# Patient Record
Sex: Female | Born: 1985 | Race: Black or African American | Hispanic: No | Marital: Single | Smoking: Never smoker
Health system: Southern US, Community
[De-identification: ages and names within clinical notes are randomized; demographics above are authoritative.]

## PROBLEM LIST (undated history)

## (undated) DIAGNOSIS — A159 Respiratory tuberculosis unspecified: Secondary | ICD-10-CM

## (undated) DIAGNOSIS — F329 Major depressive disorder, single episode, unspecified: Secondary | ICD-10-CM

## (undated) DIAGNOSIS — K219 Gastro-esophageal reflux disease without esophagitis: Secondary | ICD-10-CM

## (undated) DIAGNOSIS — F419 Anxiety disorder, unspecified: Secondary | ICD-10-CM

## (undated) DIAGNOSIS — T7840XA Allergy, unspecified, initial encounter: Secondary | ICD-10-CM

## (undated) DIAGNOSIS — R011 Cardiac murmur, unspecified: Secondary | ICD-10-CM

## (undated) DIAGNOSIS — Z201 Contact with and (suspected) exposure to tuberculosis: Secondary | ICD-10-CM

## (undated) DIAGNOSIS — I10 Essential (primary) hypertension: Secondary | ICD-10-CM

## (undated) DIAGNOSIS — F32A Depression, unspecified: Secondary | ICD-10-CM

## (undated) HISTORY — DX: Gastro-esophageal reflux disease without esophagitis: K21.9

## (undated) HISTORY — PX: WISDOM TOOTH EXTRACTION: SHX21

## (undated) HISTORY — DX: Contact with and (suspected) exposure to tuberculosis: Z20.1

## (undated) HISTORY — DX: Essential (primary) hypertension: I10

## (undated) HISTORY — DX: Respiratory tuberculosis unspecified: A15.9

## (undated) HISTORY — DX: Cardiac murmur, unspecified: R01.1

## (undated) HISTORY — DX: Allergy, unspecified, initial encounter: T78.40XA

---
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
## 2015-11-25 ENCOUNTER — Emergency Department (HOSPITAL_COMMUNITY): Payer: Self-pay

## 2015-11-25 ENCOUNTER — Emergency Department (HOSPITAL_COMMUNITY)
Admission: EM | Admit: 2015-11-25 | Discharge: 2015-11-25 | Disposition: A | Discharge status: Home / Self Care | Payer: Self-pay | Attending: Emergency Medicine | Admitting: Emergency Medicine

## 2015-11-25 DIAGNOSIS — T07XXXA Unspecified multiple injuries, initial encounter: Secondary | ICD-10-CM

## 2015-11-25 DIAGNOSIS — Y999 Unspecified external cause status: Secondary | ICD-10-CM | POA: Insufficient documentation

## 2015-11-25 DIAGNOSIS — S20111A Abrasion of breast, right breast, initial encounter: Secondary | ICD-10-CM | POA: Insufficient documentation

## 2015-11-25 DIAGNOSIS — Z23 Encounter for immunization: Secondary | ICD-10-CM | POA: Insufficient documentation

## 2015-11-25 DIAGNOSIS — S1091XA Abrasion of unspecified part of neck, initial encounter: Secondary | ICD-10-CM | POA: Insufficient documentation

## 2015-11-25 DIAGNOSIS — Y9389 Activity, other specified: Secondary | ICD-10-CM | POA: Insufficient documentation

## 2015-11-25 DIAGNOSIS — Y9259 Other trade areas as the place of occurrence of the external cause: Secondary | ICD-10-CM | POA: Insufficient documentation

## 2015-11-25 DIAGNOSIS — S30811A Abrasion of abdominal wall, initial encounter: Secondary | ICD-10-CM | POA: Insufficient documentation

## 2015-11-25 DIAGNOSIS — S0083XA Contusion of other part of head, initial encounter: Secondary | ICD-10-CM | POA: Insufficient documentation

## 2015-11-25 MED ORDER — ACETAMINOPHEN 325 MG PO TABS
650.0000 mg | ORAL_TABLET | Freq: Once | ORAL | Status: AC
  Administered 2015-11-25: 650 mg via ORAL
  Filled 2015-11-25: qty 2

## 2015-11-25 MED ORDER — TETANUS-DIPHTH-ACELL PERTUSSIS 5-2.5-18.5 LF-MCG/0.5 IM SUSP
0.5000 mL | Freq: Once | INTRAMUSCULAR | Status: AC
  Administered 2015-11-25: 0.5 mL via INTRAMUSCULAR
  Filled 2015-11-25: qty 0.5
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
## 2016-03-20 ENCOUNTER — Emergency Department (HOSPITAL_COMMUNITY)
Admission: EM | Admit: 2016-03-20 | Discharge: 2016-03-20 | Disposition: A | Discharge status: Home / Self Care | Payer: Medicaid Other | Attending: Emergency Medicine | Admitting: Emergency Medicine

## 2016-03-20 ENCOUNTER — Encounter (HOSPITAL_COMMUNITY): Payer: Self-pay

## 2016-03-20 DIAGNOSIS — K029 Dental caries, unspecified: Secondary | ICD-10-CM | POA: Insufficient documentation

## 2016-03-20 DIAGNOSIS — K0889 Other specified disorders of teeth and supporting structures: Secondary | ICD-10-CM

## 2016-03-20 MED ORDER — HYDROCODONE-ACETAMINOPHEN 5-325 MG PO TABS
ORAL_TABLET | ORAL | Status: AC
  Filled 2016-03-20: qty 1

## 2016-03-20 MED ORDER — PENICILLIN V POTASSIUM 500 MG PO TABS: 500.0000 mg | ORAL_TABLET | Freq: Four times a day (QID) | ORAL | 0 refills | Status: AC

## 2016-03-20 MED ORDER — HYDROCODONE-ACETAMINOPHEN 5-325 MG PO TABS
1.0000 | ORAL_TABLET | Freq: Once | ORAL | Status: AC
  Administered 2016-03-20: 1 via ORAL

## 2016-03-20 NOTE — ED Triage Notes (Signed)
Pt presents for evaluation of R lower dental pain x 2 days. Pt. Reports having filling recently and states she felt like it fell out. Pt. States she has been unable to sleep with ear pain to R side.

## 2016-03-20 NOTE — Discharge Instructions (Signed)
You have a dental injury. It is very important that you get evaluated by a dentist as soon as possible. Call tomorrow to schedule an appointment. Ibuprofen as needed for pain - do not take more than 800mg  three times a day. Take your full course of antibiotics. Read the instructions below.  Eat a soft or liquid diet and rinse your mouth out after meals with warm water. You should see a dentist or return here at once if you have increased swelling, increased pain or uncontrolled bleeding from the site of your injury.  SEEK MEDICAL CARE IF:  You have increased pain not controlled with medicines.  You have swelling around your tooth, in your face or neck.  You have bleeding which starts, continues, or gets worse.  You have a fever >101 If you are unable to open your mouth

## 2016-03-20 NOTE — ED Provider Notes (Signed)
[facility]-EMERGENCY DEPT Provider Note   CSN: [id number] Arrival date & time: 03/20/16  0848    History   Chief Complaint Chief Complaint  Patient presents with  . Dental Pain    HPI Liana Crockershli Saber is a 30 y.o. female   Liana Crockershli Behan is an otherwise healthy 30 y.o. female  who presents to the Emergency Department complaining of worsening right lower dental pain for last 4 days. Patient has described similar pain before and has been told that she has needed an extraction. She is new to the area and does not have a local dentist. The pain is described as pounding throbbing that will intermittent radiation to right ear. She denies fever, difficulty swallowing, or difficulty breathing.  The history is provided by the patient and medical records. No language interpreter was used.    History reviewed. No pertinent past medical history.  There are no active problems to display for this patient.   History reviewed. No pertinent surgical history.  OB History    No data available       Home Medications    Prior to Admission medications   Not on File    Family History No family history on file.  Social History Social History  Substance Use Topics  . Smoking status: Not on file  . Smokeless tobacco: Not on file  . Alcohol use Not on file     Allergies   Iodine and Shellfish allergy   Review of Systems Review of Systems  Constitutional: Negative for fever.  HENT: Positive for dental problem. Negative for sore throat and trouble swallowing.   Respiratory: Negative for cough and shortness of breath.      Physical Exam Updated Vital Signs BP (!) 140/110 (BP Location: Right Arm)   Pulse 104   Temp 99.1 F (37.3 C) (Oral)   Resp 14   Ht 5\' 1"  (1.549 m)   Wt 145 lb (65.8 kg)   LMP 02/22/2016 (Exact Date)   SpO2 100%   BMI 27.40 kg/m   Physical Exam  Constitutional: She is oriented to person, place, and time. She appears well-developed and well-nourished. No  distress.  HENT:  Head: Normocephalic and atraumatic.  Mouth/Throat:    Dental cavities and poor oral dentition noted, pain along tooth  as depicted in image, midline uvula, no trismus, oropharynx moist and clear, no abscess noted, no oropharyngeal erythema or edema, neck supple and no tenderness. No facial edema  Cardiovascular: Normal rate, regular rhythm and normal heart sounds.   No murmur heard. Pulmonary/Chest: Effort normal and breath sounds normal. No respiratory distress.  Abdominal: Soft. She exhibits no distension. There is no tenderness.  Musculoskeletal: She exhibits no edema.  Neurological: She is alert and oriented to person, place, and time.  Skin: Skin is warm and dry.  Nursing note and vitals reviewed.    ED Treatments / Results  Labs (all labs ordered are listed, but only abnormal results are displayed) Labs Reviewed - No data to display  EKG  EKG Interpretation None       Radiology No results found.  Procedures Procedures (including critical care time)  Medications Ordered in ED Medications  HYDROcodone-acetaminophen (NORCO/VICODIN) 5-325 MG per tablet 1 tablet (not administered)     Initial Impression / Assessment and Plan / ED Course  I have reviewed the triage vital signs and the nursing notes.  Pertinent labs & imaging results that were available during my care of the patient were reviewed by me and considered  in my medical decision making (see chart for details).  Clinical Course    Patient with dentalgia. No abscess requiring immediate incision and drainage. Patient is afebrile, non toxic appearing, and swallowing secretions well. Exam not concerning for Ludwig's angina or pharyngeal abscess. Will treat with PenVK. I provided dental resource guide and stressed the importance of dental follow up for ultimate management of dental pain. BP elevated - no hx of HTN. PCP follow up for BP recheck. Patient voices understanding and is agreeable to  plan.  Final Clinical Impressions(s) / ED Diagnoses   Final diagnoses:  Pain, dental    New Prescriptions New Prescriptions   No medications on file     [facility]maJaime Pilcher Sindee Stucker, PA-C 03/20/16 [id number]    Gwyneth SproutWhitney Plunkett, MD 03/20/16 1104

## 2016-06-05 DIAGNOSIS — H40033 Anatomical narrow angle, bilateral: Secondary | ICD-10-CM | POA: Diagnosis not present

## 2016-06-05 DIAGNOSIS — H16223 Keratoconjunctivitis sicca, not specified as Sjogren's, bilateral: Secondary | ICD-10-CM | POA: Diagnosis not present

## 2016-07-16 ENCOUNTER — Encounter (HOSPITAL_COMMUNITY): Payer: Self-pay | Admitting: Emergency Medicine

## 2016-07-16 ENCOUNTER — Emergency Department (HOSPITAL_COMMUNITY)
Admission: EM | Admit: 2016-07-16 | Discharge: 2016-07-16 | Disposition: A | Discharge status: Home / Self Care | Payer: Medicaid Other | Attending: Emergency Medicine | Admitting: Emergency Medicine

## 2016-07-16 DIAGNOSIS — T7849XA Other allergy, initial encounter: Secondary | ICD-10-CM | POA: Insufficient documentation

## 2016-07-16 DIAGNOSIS — R0602 Shortness of breath: Secondary | ICD-10-CM

## 2016-07-16 DIAGNOSIS — R062 Wheezing: Secondary | ICD-10-CM | POA: Diagnosis not present

## 2016-07-16 DIAGNOSIS — Z9109 Other allergy status, other than to drugs and biological substances: Secondary | ICD-10-CM

## 2016-07-16 MED ORDER — ALBUTEROL SULFATE HFA 108 (90 BASE) MCG/ACT IN AERS
2.0000 | INHALATION_SPRAY | Freq: Once | RESPIRATORY_TRACT | Status: AC
  Administered 2016-07-16: 2 via RESPIRATORY_TRACT
  Filled 2016-07-16: qty 6.7

## 2016-07-16 MED ORDER — DEXAMETHASONE SODIUM PHOSPHATE 10 MG/ML IJ SOLN
10.0000 mg | Freq: Once | INTRAMUSCULAR | Status: AC
  Administered 2016-07-16: 10 mg via INTRAMUSCULAR
  Filled 2016-07-16: qty 1

## 2016-07-16 NOTE — Discharge Instructions (Signed)
Please take your Benadryl to help with severe allergic reaction. You may substitute this for Zyrtec as it may provide longer allergy relief for you. He received steroids today that will last several days. Please use the albuterol inhaler if you began having worsening breathing and wheezing. Please schedule an appointment with your primary care physician for further management and establishment of care. If symptoms change or worsen, please return to the nearest emergency department.

## 2016-07-16 NOTE — ED Provider Notes (Signed)
[facility]-EMERGENCY DEPT Provider Note   CSN: [id number] Arrival date & time: 07/16/16  1815  By signing my name below, I, Marnette Burgess Long, attest that this documentation has been prepared under the direction and in the presence of Heide Scales, MD. Electronically Signed: Marnette Burgess Long, Scribe. 07/16/2016. 7:59 PM.  History   Chief Complaint Chief Complaint  Patient presents with  . Shortness of Breath   The history is provided by the patient and medical records. No language interpreter was used.  Cough  This is a new problem. The current episode started more than 2 days ago. The problem occurs constantly. The problem has been gradually worsening. The cough is non-productive. There has been no fever. Associated symptoms include rhinorrhea and shortness of breath. Pertinent negatives include no chest pain, no chills, no headaches and no wheezing. She has tried nothing for the symptoms. The treatment provided mild relief. She is not a smoker. Her past medical history does not include bronchitis, pneumonia, COPD or asthma.  Shortness of Breath  This is a new problem. The problem occurs continuously.The problem has not changed since onset.Associated symptoms include rhinorrhea and cough. Pertinent negatives include no fever, no headaches, no neck pain, no wheezing, no chest pain, no vomiting, no abdominal pain, no rash and no leg swelling. The problem's precipitants include pollens. She has tried nothing for the symptoms. She has had no prior ED visits. Associated medical issues do not include asthma, COPD, pneumonia, chronic lung disease, PE, CAD or heart failure.    HPI Comments:  Sandy Townsend is a 31 y.o. female who presents to the Emergency Department complaining of persistent, gradually worsening cough three days ago. She reports this dry cough started three days ago and has worsened since onset. She states a h/o seasonal allergies that are "at the worst they have ever been" since moving  here from [place] last August. When she has these coughing episodes, she feels "heaviness" and chest tightness that has given her trouble sleeping. These episodes are followed by a "burning, itchy feeling" that spreads up her chest and neck. No acute injuries or trauma. Pt has associated symptoms of SOB, rhinorrhea, and a post nasal drip. No h/o Asthma. She tried Benadryl with minimal relief of her symptoms. Pt denies wheezing, rash, fever, chills, dysuria, hematuria, constipation, diarrhea, leg swelling, and any other complaints at this time. Pt does not currently have a PCP.    No past medical history on file.  There are no active problems to display for this patient.  No past surgical history on file.  OB History    No data available     Home Medications    Prior to Admission medications   Not on File   Family History No family history on file.  Social History Social History  Substance Use Topics  . Smoking status: Never Smoker  . Smokeless tobacco: Never Used  . Alcohol use Not on file   Allergies   Iodine and Shellfish allergy  Review of Systems Review of Systems  Constitutional: Negative for activity change, chills, diaphoresis, fatigue and fever.  HENT: Positive for postnasal drip and rhinorrhea. Negative for congestion.   Eyes: Negative for visual disturbance.  Respiratory: Positive for cough, chest tightness and shortness of breath. Negative for wheezing and stridor.   Cardiovascular: Negative for chest pain, palpitations and leg swelling.  Gastrointestinal: Negative for abdominal distention, abdominal pain, constipation, diarrhea, nausea and vomiting.  Genitourinary: Negative for difficulty urinating, dysuria, flank pain, frequency,  hematuria, menstrual problem, pelvic pain, vaginal bleeding and vaginal discharge.  Musculoskeletal: Negative for back pain and neck pain.  Skin: Negative for rash and wound.  Neurological: Negative for dizziness, weakness,  light-headedness, numbness and headaches.  Psychiatric/Behavioral: Positive for sleep disturbance. Negative for agitation and confusion.  All other systems reviewed and are negative.   Physical Exam Updated Vital Signs BP (!) 133/103 (BP Location: Right Arm)   Pulse 85   Temp 98.4 F (36.9 C) (Oral)   Resp 18   Ht  (1.549 m)   Wt 150 lb (68 kg)   SpO2 98%   BMI 28.34 kg/m   Physical Exam  Constitutional: She is oriented to person, place, and time. She appears well-developed and well-nourished. No distress.  HENT:  Head: Normocephalic and atraumatic.  Right Ear: External ear normal.  Left Ear: External ear normal.  Nose: Rhinorrhea present.  Mouth/Throat: Oropharynx is clear and moist. No oropharyngeal exudate.  visible rhinorrhea   Eyes: Conjunctivae and EOM are normal. Pupils are equal, round, and reactive to light.  Watery eyes  Neck: Normal range of motion. Neck supple.  Cardiovascular: Normal rate and regular rhythm.   No BLE edema  Pulmonary/Chest: No stridor. No respiratory distress. She exhibits no tenderness.  Small wheezing present in right lower lobe. Audible congestion  Abdominal: Soft. She exhibits no distension. There is no tenderness. There is no rebound and no guarding.  Neurological: She is alert and oriented to person, place, and time. She has normal reflexes. She exhibits normal muscle tone. Coordination normal.  Skin: Skin is warm. No rash noted. She is not diaphoretic. No erythema.  Nursing note and vitals reviewed.   ED Treatments / Results  DIAGNOSTIC STUDIES:  Oxygen Saturation is 98% on RA, normal by my interpretation.    COORDINATION OF CARE:  7:57 PM Discussed treatment plan with pt at bedside including Decadron, breathing Tx, and pt agreed to plan.  Labs (all labs ordered are listed, but only abnormal results are displayed) Labs Reviewed - No data to display  EKG  EKG Interpretation None       Radiology No results  found.  Procedures Procedures (including critical care time)  Medications Ordered in ED Medications  dexamethasone (DECADRON) injection 10 mg (10 mg Intramuscular Given 07/16/16 2023)  albuterol (PROVENTIL HFA;VENTOLIN HFA) 108 (90 Base) MCG/ACT inhaler 2 puff (2 puffs Inhalation Given 07/16/16 2023)     Initial Impression / Assessment and Plan / ED Course  I have reviewed the triage vital signs and the nursing notes.  Pertinent labs & imaging results that were available during my care of the patient were reviewed by me and considered in my medical decision making (see chart for details).     Sandy Townsend is a 31 y.o. female who presents to the Emergency Department complaining of persistent, gradually worsening cough three days ago. Patient also reports feeling some wheezing, congestion, and watery eyes. Patient thinks she is having allergies as this is her first spring in [place].  On exam, patient did have watery eyes. Patient had lungs with mild wheezing and there right lung base. Patient's chest was nontender. No CVA tenderness. Abdomen was nontender. Exam otherwise unremarkable with no rashes.  Suspect seasonal allergies are causing exacerbation of possible underlying reactive airway disease. Patient says that she had a history of bronchitis as a child.  With the wheezing, patient was given albuterol in the ED. Patient had resolution of shortness of breath and wheezing on reassessment.  Patient felt much better. Patient was given a Decadron shot which continue to improve her symptoms.  Patient was observed for a period time with continued resolution of symptoms. Given the lack of rhonchi or abnormalities on her vitals, do not feel patient needs chest x-ray at this time. Patient has no other signs or symptoms of pneumonia.  Patient given instructions to follow-up with a primary care physician for further management. Patient is not established [place]. Patient given  instructions to take Zyrtec as well as return if any symptoms worsen. Patient understood plan of care and other persons or concerns. Patient was discharged in good condition.    Final Clinical Impressions(s) / ED Diagnoses   Final diagnoses:  SOB (shortness of breath)  Wheezing  Environmental allergies    New Prescriptions There are no discharge medications for this patient.   Clinical Impression: 1. SOB (shortness of breath)   2. Wheezing   3. Environmental allergies     Disposition: Discharge  Condition: Good  I have discussed the results, Dx and Tx plan with the pt(& family if present). He/she/they expressed understanding and agree(s) with the plan. Discharge instructions discussed at great length. Strict return precautions discussed and pt &/or family have verbalized understanding of the instructions. No further questions at time of discharge.    There are no discharge medications for this patient.   Follow Up: [facility] [address] [place] [address] [phone] Schedule an appointment as soon as possible for a visit    [facility] EMERGENCY DEPARTMENT [address] [id number] mc [place] [address] [phone]  If symptoms worsen      Heide Scales, MD 07/16/16 2142

## 2016-07-16 NOTE — ED Triage Notes (Signed)
Patient present with sudden onset of SOB about 3h ago. Patient reports unable to get full breath without coughing violently. Moved from [facility] last August. Unable to clear throat without mucus returning.

## 2016-07-16 NOTE — ED Notes (Signed)
Hx of anxiety.  States she is under a lot of pressure lately.  She also states that all week her allergies have been severe and has been taking benadryl.  SOB came on abruptly 3 hours before arriving at ED.  States it felt like an elephant was sitting on chest and coughing would not stop for an hour.  Felt like throat was on fire.  Denies fever/N/V/D.  Denies being sick lately.  Had a lot of teeth removed recently.

## 2017-12-30 ENCOUNTER — Ambulatory Visit: Payer: Medicaid Other | Admitting: Family Medicine

## 2017-12-30 ENCOUNTER — Encounter: Payer: Self-pay | Admitting: Family Medicine

## 2017-12-30 VITALS — BP 129/88 | HR 90 | Ht 61.0 in | Wt 178.0 lb

## 2017-12-30 DIAGNOSIS — N3941 Urge incontinence: Secondary | ICD-10-CM | POA: Diagnosis not present

## 2017-12-30 DIAGNOSIS — Z975 Presence of (intrauterine) contraceptive device: Secondary | ICD-10-CM

## 2017-12-30 DIAGNOSIS — Z7689 Persons encountering health services in other specified circumstances: Secondary | ICD-10-CM

## 2017-12-30 DIAGNOSIS — F331 Major depressive disorder, recurrent, moderate: Secondary | ICD-10-CM

## 2017-12-30 DIAGNOSIS — F339 Major depressive disorder, recurrent, unspecified: Secondary | ICD-10-CM | POA: Insufficient documentation

## 2017-12-30 MED ORDER — BUPROPION HCL ER (XL) 150 MG PO TB24: 150.0000 mg | ORAL_TABLET | Freq: Every day | ORAL | 2 refills | Status: DC

## 2017-12-30 NOTE — Progress Notes (Signed)
BP 129/88   Pulse 90   Ht 5\' 1"  (1.549 m)   Wt 178 lb (80.7 kg)   SpO2 97%   BMI 33.63 kg/m    Subjective:    Patient ID: Sandy Townsend, female    DOB: May 06, 1985, 32 y.o.   MRN: [id number]  HPI: Sandy Townsend is a 32 y.o. female  Chief Complaint  Patient presents with  . Establish Care    Needs pap and IUD removed, discuss other BC options, bladder leakage   Here today to establish care.   Had IUD placed in September 2009. Heavy bleeding and cramping x 1 year. Wanting to have it removed.   Bladder leakage since having children, has been doing exercises with mild relief. Denies frequent UTIs, dysuria, pelvic pain.   No known chronic medical issues. Tests positive to TB but has been checked and does not have the infection.   Only concern today is low energy, irritable, fatigue increasingly the past 2 years since moving here from [facility]. 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 Has gained over 50 lb since moving. Overeating some days. Has always had some mild depression but never bothersome enough to seek care until this past 2 years. Denies SI/HI, severe mood swings.   Depression screen PHQ 2/9 12/30/2017  Decreased Interest 1  Down, Depressed, Hopeless 0  PHQ - 2 Score 1  Altered sleeping 2  Tired, decreased energy 3  Change in appetite 3  Feeling bad or failure about yourself  1  Trouble concentrating 1  Moving slowly or fidgety/restless 3  Suicidal thoughts 0  PHQ-9 Score 14    Relevant past medical, surgical, family and social history reviewed and updated as indicated. Interim medical history since our last visit reviewed. Allergies and medications reviewed and updated.  Review of Systems  Per HPI unless specifically indicated above     Objective:    BP 129/88   Pulse 90   Ht 5\' 1"  (1.549 m)   Wt 178 lb (80.7 kg)   SpO2 97%   BMI 33.63 kg/m   Wt Readings from Last 3 Encounters:  12/30/17 178 lb (80.7 kg)  07/16/16 150 lb (68 kg)  03/20/16 145 lb (65.8 kg)      Physical Exam  Constitutional: She is oriented to person, place, and time. She appears well-developed and well-nourished. No distress.  HENT:  Head: Atraumatic.  Eyes: Conjunctivae and EOM are normal.  Neck: Normal range of motion. Neck supple.  Cardiovascular: Normal rate, regular rhythm and normal heart sounds.  Pulmonary/Chest: Effort normal and breath sounds normal.  Abdominal: Soft. Bowel sounds are normal. There is no tenderness. There is no guarding.  Musculoskeletal: Normal range of motion.  Neurological: She is alert and oriented to person, place, and time.  Skin: Skin is warm and dry.  Psychiatric: She has a normal mood and affect. Her behavior is normal.  Nursing note and vitals reviewed.   No results found for this or any previous visit.    Assessment & Plan:   Problem List Items Addressed This Visit      Other   Major depression, recurrent (HCC) - Primary    Will trial wellbutrin. Risks and benefits reviewed. Counseling recommended, packet given.       Relevant Medications   buPROPion (WELLBUTRIN XL) 150 MG 24 hr tablet   Urge incontinence    Continue working on pelvic floor strengthening exercises. Declines medication. F/u if not improving  IUD (intrauterine device) in place    Will follow up with GYN for removal. Discussed other options once removed, pt will consider and let us know.        Other Visit Diagnoses    Encounter to establish care           Follow up plan: Return in about 4 weeks (around 01/27/2018) for Mood.

## 2018-01-13 DIAGNOSIS — F332 Major depressive disorder, recurrent severe without psychotic features: Secondary | ICD-10-CM | POA: Diagnosis not present

## 2018-01-20 DIAGNOSIS — F332 Major depressive disorder, recurrent severe without psychotic features: Secondary | ICD-10-CM | POA: Diagnosis not present

## 2018-01-24 DIAGNOSIS — Z975 Presence of (intrauterine) contraceptive device: Secondary | ICD-10-CM | POA: Insufficient documentation

## 2018-01-24 DIAGNOSIS — N3941 Urge incontinence: Secondary | ICD-10-CM | POA: Insufficient documentation

## 2018-01-24 NOTE — Patient Instructions (Signed)
Follow up in 1 month   

## 2018-01-24 NOTE — Assessment & Plan Note (Signed)
Will follow up with GYN for removal. Discussed other options once removed, pt will consider and let us know.

## 2018-01-24 NOTE — Assessment & Plan Note (Signed)
Will trial wellbutrin. Risks and benefits reviewed. Counseling recommended, packet given.

## 2018-01-24 NOTE — Assessment & Plan Note (Signed)
Continue working on pelvic floor strengthening exercises. Declines medication. F/u if not improving

## 2018-01-27 ENCOUNTER — Ambulatory Visit: Payer: Medicaid Other | Admitting: Family Medicine

## 2018-01-27 ENCOUNTER — Encounter: Payer: Self-pay | Admitting: Family Medicine

## 2018-01-27 VITALS — BP 145/100 | HR 76 | Temp 97.9°F | Wt 172.0 lb

## 2018-01-27 DIAGNOSIS — F331 Major depressive disorder, recurrent, moderate: Secondary | ICD-10-CM | POA: Diagnosis not present

## 2018-01-27 DIAGNOSIS — F332 Major depressive disorder, recurrent severe without psychotic features: Secondary | ICD-10-CM | POA: Diagnosis not present

## 2018-01-27 NOTE — Assessment & Plan Note (Signed)
Significant improvement with addition of wellbutrin and counseling. Continue current regimen

## 2018-01-27 NOTE — Patient Instructions (Signed)
Follow up in 6 months 

## 2018-01-27 NOTE — Progress Notes (Signed)
   BP (!) 145/100   Pulse 76   Temp 97.9 F (36.6 C) (Oral)   Wt 172 lb (78 kg)   SpO2 99%   BMI 32.50 kg/m    Subjective:    Patient ID: Sandy Townsend, female    DOB: 02/26/1986, 32 y.o.   MRN: [id number]  HPI: Sandy Townsend is a 32 y.o. female  Chief Complaint  Patient presents with  . Depression    1 month f/u. No side effects from Wellbutrin. Is down 6 lbs. Has noticed less crying.    Here today for mood f/u after starting wellbutrin. Has been on for about 3 weeks now. Improved irritability, less crying spells, down 6 lb. No worsening issues or negative side effects. Going to the gym 4 times a week now. Has also started counseling which is helping quite a bit. Denies SI/HI.   Depression screen [facility] 2/9 01/27/2018 12/30/2017  Decreased Interest 0 1  Down, Depressed, Hopeless 0 0  PHQ - 2 Score 0 1  Altered sleeping 1 2  Tired, decreased energy 1 3  Change in appetite 1 3  Feeling bad or failure about yourself  0 1  Trouble concentrating 0 1  Moving slowly or fidgety/restless 1 3  Suicidal thoughts 0 0  PHQ-9 Score 4 14    Relevant past medical, surgical, family and social history reviewed and updated as indicated. Interim medical history since our last visit reviewed. Allergies and medications reviewed and updated.  Review of Systems  Per HPI unless specifically indicated above     Objective:    BP (!) 145/100   Pulse 76   Temp 97.9 F (36.6 C) (Oral)   Wt 172 lb (78 kg)   SpO2 99%   BMI 32.50 kg/m   Wt Readings from Last 3 Encounters:  01/27/18 172 lb (78 kg)  12/30/17 178 lb (80.7 kg)  07/16/16 150 lb (68 kg)    Physical Exam  Constitutional: She is oriented to person, place, and time. She appears well-developed and well-nourished.  HENT:  Head: Atraumatic.  Eyes: Conjunctivae and EOM are normal.  Neck: Normal range of motion. Neck supple.  Cardiovascular: Normal rate and regular rhythm.  Pulmonary/Chest: Effort normal and breath sounds normal.    Musculoskeletal: Normal range of motion.  Neurological: She is alert and oriented to person, place, and time.  Skin: Skin is warm and dry.  Psychiatric: She has a normal mood and affect. Her behavior is normal.  Nursing note and vitals reviewed.   No results found for this or any previous visit.    Assessment & Plan:   Problem List Items Addressed This Visit      Other   Major depression, recurrent (HCC) - Primary    Significant improvement with addition of wellbutrin and counseling. Continue current regimen          Follow up plan: Return in about 6 months (around 07/29/2018) for Mood f/u.

## 2018-02-03 DIAGNOSIS — F332 Major depressive disorder, recurrent severe without psychotic features: Secondary | ICD-10-CM | POA: Diagnosis not present

## 2018-02-16 DIAGNOSIS — Z0389 Encounter for observation for other suspected diseases and conditions ruled out: Secondary | ICD-10-CM | POA: Diagnosis not present

## 2018-02-16 DIAGNOSIS — Z01419 Encounter for gynecological examination (general) (routine) without abnormal findings: Secondary | ICD-10-CM | POA: Diagnosis not present

## 2018-02-16 DIAGNOSIS — Z3009 Encounter for other general counseling and advice on contraception: Secondary | ICD-10-CM | POA: Diagnosis not present

## 2018-02-16 DIAGNOSIS — N760 Acute vaginitis: Secondary | ICD-10-CM | POA: Diagnosis not present

## 2018-02-16 DIAGNOSIS — Z1388 Encounter for screening for disorder due to exposure to contaminants: Secondary | ICD-10-CM | POA: Diagnosis not present

## 2018-02-16 DIAGNOSIS — Z30011 Encounter for initial prescription of contraceptive pills: Secondary | ICD-10-CM | POA: Diagnosis not present

## 2018-02-16 DIAGNOSIS — Z30431 Encounter for routine checking of intrauterine contraceptive device: Secondary | ICD-10-CM | POA: Diagnosis not present

## 2018-02-24 DIAGNOSIS — F332 Major depressive disorder, recurrent severe without psychotic features: Secondary | ICD-10-CM | POA: Diagnosis not present

## 2018-03-01 ENCOUNTER — Ambulatory Visit: Payer: Medicaid Other | Admitting: Family Medicine

## 2018-03-01 ENCOUNTER — Encounter: Payer: Self-pay | Admitting: Family Medicine

## 2018-03-01 VITALS — BP 134/94 | HR 93 | Temp 98.8°F | Ht 61.0 in | Wt 170.0 lb

## 2018-03-01 DIAGNOSIS — R030 Elevated blood-pressure reading, without diagnosis of hypertension: Secondary | ICD-10-CM

## 2018-03-01 DIAGNOSIS — G4700 Insomnia, unspecified: Secondary | ICD-10-CM | POA: Diagnosis not present

## 2018-03-01 MED ORDER — BUPROPION HCL ER (XL) 150 MG PO TB24: 150.0000 mg | ORAL_TABLET | Freq: Every day | ORAL | 2 refills | Status: DC

## 2018-03-01 MED ORDER — AMLODIPINE BESYLATE 5 MG PO TABS: 5.0000 mg | ORAL_TABLET | Freq: Every day | ORAL | 0 refills | Status: DC

## 2018-03-01 NOTE — Progress Notes (Signed)
   BP (!) 134/94   Pulse 93   Temp 98.8 F (37.1 C)   Ht 5\' 1"  (1.549 m)   Wt 170 lb (77.1 kg) Comment: Patient reported  SpO2 99%   BMI 32.12 kg/m    Subjective:    Patient ID: Sandy CrockerAshli Townsend, female    DOB: 05/13/1985, 32 y.o.   MRN: [id number]  HPI: Sandy Townsend is a 32 y.o. female  Chief Complaint  Patient presents with  . Hypertension    Patient states that she has had some headaches.    Here today with concerns about her BP. Was noted to have elevated BP at the GYN office recently. BPs persistently high lately and having frequent headaches. States her BPs have always run on the higher side but have never been this consistently high before. Denies CP, SOB, palpitations. Under a significant amount of stress finishing up her degree next week and with the holidays. Not sleeping hardly at all, overwhelmed with school work and taking care of the kids.   Relevant past medical, surgical, family and social history reviewed and updated as indicated. Interim medical history since our last visit reviewed. Allergies and medications reviewed and updated.  Review of Systems  Per HPI unless specifically indicated above     Objective:    BP (!) 134/94   Pulse 93   Temp 98.8 F (37.1 C)   Ht 5\' 1"  (1.549 m)   Wt 170 lb (77.1 kg) Comment: Patient reported  SpO2 99%   BMI 32.12 kg/m   Wt Readings from Last 3 Encounters:  03/01/18 170 lb (77.1 kg)  01/27/18 172 lb (78 kg)  12/30/17 178 lb (80.7 kg)    Physical Exam  Constitutional: She is oriented to person, place, and time. She appears well-developed and well-nourished. No distress.  HENT:  Head: Atraumatic.  Eyes: Conjunctivae and EOM are normal.  Neck: Normal range of motion. Neck supple.  Cardiovascular: Normal rate, regular rhythm and normal heart sounds.  Pulmonary/Chest: Effort normal and breath sounds normal.  Musculoskeletal: Normal range of motion.  Neurological: She is alert and oriented to person, place, and time.    Skin: Skin is warm and dry.  Psychiatric: She has a normal mood and affect. Her behavior is normal.  Nursing note and vitals reviewed.   No results found for this or any previous visit.    Assessment & Plan:   Problem List Items Addressed This Visit    None    Visit Diagnoses    Elevated blood-pressure reading without diagnosis of hypertension    -  Primary   Given recent persistence, will start low dose amlodipine and recheck in 2 wks. Suspect stress and sleep playing a large role. DASH diet, stress reduction   Insomnia, unspecified type       Discussed some options, she's wanting to get graduation behind her next week and see if things improve with schedule changes prior to starting medication       Follow up plan: Return in about 2 weeks (around 03/15/2018) for BP.

## 2018-03-02 ENCOUNTER — Encounter: Payer: Self-pay | Admitting: Family Medicine

## 2018-03-03 DIAGNOSIS — F332 Major depressive disorder, recurrent severe without psychotic features: Secondary | ICD-10-CM | POA: Diagnosis not present

## 2018-03-12 DIAGNOSIS — F332 Major depressive disorder, recurrent severe without psychotic features: Secondary | ICD-10-CM | POA: Diagnosis not present

## 2018-03-15 ENCOUNTER — Ambulatory Visit: Payer: Medicaid Other | Admitting: Family Medicine

## 2018-03-16 DIAGNOSIS — Z30432 Encounter for removal of intrauterine contraceptive device: Secondary | ICD-10-CM | POA: Diagnosis not present

## 2018-03-16 DIAGNOSIS — Z124 Encounter for screening for malignant neoplasm of cervix: Secondary | ICD-10-CM | POA: Diagnosis not present

## 2018-03-16 DIAGNOSIS — Z3041 Encounter for surveillance of contraceptive pills: Secondary | ICD-10-CM | POA: Diagnosis not present

## 2018-03-17 DIAGNOSIS — F332 Major depressive disorder, recurrent severe without psychotic features: Secondary | ICD-10-CM | POA: Diagnosis not present

## 2018-03-19 ENCOUNTER — Ambulatory Visit: Payer: Medicaid Other | Admitting: Family Medicine

## 2018-04-07 DIAGNOSIS — F332 Major depressive disorder, recurrent severe without psychotic features: Secondary | ICD-10-CM | POA: Diagnosis not present

## 2018-04-09 ENCOUNTER — Ambulatory Visit (INDEPENDENT_AMBULATORY_CARE_PROVIDER_SITE_OTHER): Payer: Medicaid Other | Admitting: Family Medicine

## 2018-04-09 ENCOUNTER — Encounter: Payer: Self-pay | Admitting: Family Medicine

## 2018-04-09 VITALS — BP 115/83 | HR 77 | Temp 98.5°F | Ht 62.0 in | Wt 172.0 lb

## 2018-04-09 DIAGNOSIS — Z0000 Encounter for general adult medical examination without abnormal findings: Secondary | ICD-10-CM | POA: Diagnosis not present

## 2018-04-09 DIAGNOSIS — Z111 Encounter for screening for respiratory tuberculosis: Secondary | ICD-10-CM

## 2018-04-09 DIAGNOSIS — F331 Major depressive disorder, recurrent, moderate: Secondary | ICD-10-CM | POA: Diagnosis not present

## 2018-04-09 LAB — UA/M W/RFLX CULTURE, ROUTINE
BILIRUBIN UA: NEGATIVE
Glucose, UA: NEGATIVE
Ketones, UA: NEGATIVE
Leukocytes, UA: NEGATIVE
NITRITE UA: NEGATIVE
PH UA: 7.5 (ref 5.0–7.5)
PROTEIN UA: NEGATIVE
RBC UA: NEGATIVE
SPEC GRAV UA: 1.015 (ref 1.005–1.030)
UUROB: 0.2 mg/dL (ref 0.2–1.0)

## 2018-04-09 MED ORDER — BUPROPION HCL ER (XL) 300 MG PO TB24: 300.0000 mg | ORAL_TABLET | Freq: Every day | ORAL | 0 refills | Status: DC

## 2018-04-09 NOTE — Progress Notes (Signed)
BP 115/83 (BP Location: Right Arm, Cuff Size: Normal)   Pulse 77   Temp 98.5 F (36.9 C) (Oral)   Ht 5\' 2"  (1.575 m)   Wt 172 lb (78 kg)   LMP 03/06/2018 (Exact Date)   BMI 31.46 kg/m    Subjective:    Patient ID: Sandy Townsend, female    DOB: Mar 02, 1986, 33 y.o.   MRN: [id number]  HPI: Sandy Townsend is a 33 y.o. female presenting on 04/09/2018 for comprehensive medical examination. Current medical complaints include:see below  Taking a mini pill now for contraception, got the IUD removed at the health department and a full female exam recently. Considering the nexplanon but has not made up her mind.   Feels like she's not in a good place with regard to her moods. The holidays were very stressful for her and she feels she's been in a fog since. Crying often, no desire to do anything, eating too much. Currently working with a counselor which is going well. Denies SI/HI. Taking 150 mg wellbutrin XL which did help quite a bit initially but now feels like it isn't enough. Not sleeping more than a couple hours nightly due to her stress.  Checking BPs about once weekly and notes significant improvement with average readings 110-120/70s-80s. No side effects, CP, SOB, leg swelling, HAs. Trying to reduce sodium intake and control stress levels.  She currently lives with: Menopausal Symptoms: no  Depression Screen done today and results listed below:  Depression screen [facility] 2/9 04/09/2018 01/27/2018 12/30/2017  Decreased Interest 1 0 1  Down, Depressed, Hopeless 1 0 0  PHQ - 2 Score 2 0 1  Altered sleeping 1 1 2   Tired, decreased energy 1 1 3   Change in appetite 0 1 3  Feeling bad or failure about yourself  1 0 1  Trouble concentrating 0 0 1  Moving slowly or fidgety/restless 1 1 3   Suicidal thoughts 0 0 0  PHQ-9 Score 6 4 14     The patient does not have a history of falls. I did not complete a risk assessment for falls. A plan of care for falls was not documented.   Past Medical  History:  Past Medical History:  Diagnosis Date  . Exposure to TB   . Heart murmur     Surgical History:  History reviewed. No pertinent surgical history.  Medications:  Current Outpatient Medications on File Prior to Visit  Medication Sig  . amLODipine (NORVASC) 5 MG tablet Take 1 tablet (5 mg total) by mouth daily.   No current facility-administered medications on file prior to visit.     Allergies:  Allergies  Allergen Reactions  . Iodine Anaphylaxis  . Shellfish Allergy Anaphylaxis and Swelling  . Pollen Extract Itching    Social History:  Social History   Socioeconomic History  . Marital status: Single    Spouse name: Not on file  . Number of children: Not on file  . Years of education: Not on file  . Highest education level: Not on file  Occupational History  . Not on file  Social Needs  . Financial resource strain: Not on file  . Food insecurity:    Worry: Not on file    Inability: Not on file  . Transportation needs:    Medical: Not on file    Non-medical: Not on file  Tobacco Use  . Smoking status: Never Smoker  . Smokeless tobacco: Never Used  Substance and Sexual Activity  . Alcohol  use: Yes    Alcohol/week: 2.0 standard drinks    Types: 2 Glasses of wine per week  . Drug use: Never  . Sexual activity: Yes    Birth control/protection: Pill  Lifestyle  . Physical activity:    Days per week: Not on file    Minutes per session: Not on file  . Stress: Not on file  Relationships  . Social connections:    Talks on phone: Not on file    Gets together: Not on file    Attends religious service: Not on file    Active member of club or organization: Not on file    Attends meetings of clubs or organizations: Not on file    Relationship status: Not on file  . Intimate partner violence:    Fear of current or ex partner: Not on file    Emotionally abused: Not on file    Physically abused: Not on file    Forced sexual activity: Not on file  Other  Topics Concern  . Not on file  Social History Narrative  . Not on file   Social History   Tobacco Use  Smoking Status Never Smoker  Smokeless Tobacco Never Used   Social History   Substance and Sexual Activity  Alcohol Use Yes  . Alcohol/week: 2.0 standard drinks  . Types: 2 Glasses of wine per week    Family History:  Family History  Problem Relation Age of Onset  . Diabetes Mother   . Hypercalcemia Mother   . Hypertension Mother   . Hypertension Father   . Diabetes Paternal Grandmother   . Diabetes Paternal Grandfather     Past medical history, surgical history, medications, allergies, family history and social history reviewed with patient today and changes made to appropriate areas of the chart.   Review of Systems - General ROS: positive for  - sleep disturbance Psychological ROS: positive for - anxiety, depression and sleep disturbances Ophthalmic ROS: negative ENT ROS: negative Allergy and Immunology ROS: negative Hematological and Lymphatic ROS: negative Endocrine ROS: negative Breast ROS: negative for breast lumps Respiratory ROS: no cough, shortness of breath, or wheezing Cardiovascular ROS: no chest pain or dyspnea on exertion Gastrointestinal ROS: no abdominal pain, change in bowel habits, or black or bloody stools Genito-Urinary ROS: no dysuria, trouble voiding, or hematuria Musculoskeletal ROS: negative Neurological ROS: no TIA or stroke symptoms Dermatological ROS: negative All other ROS negative except what is listed above and in the HPI.      Objective:    BP 115/83 (BP Location: Right Arm, Cuff Size: Normal)   Pulse 77   Temp 98.5 F (36.9 C) (Oral)   Ht 5\' 2"  (1.575 m)   Wt 172 lb (78 kg)   LMP 03/06/2018 (Exact Date)   BMI 31.46 kg/m   Wt Readings from Last 3 Encounters:  04/09/18 172 lb (78 kg)  03/01/18 170 lb (77.1 kg)  01/27/18 172 lb (78 kg)    Physical Exam Vitals signs and nursing note reviewed.  Constitutional:       General: She is not in acute distress.    Appearance: She is well-developed.  HENT:     Head: Atraumatic.     Right Ear: External ear normal.     Left Ear: External ear normal.     Nose: Nose normal.     Mouth/Throat:     Pharynx: No oropharyngeal exudate.  Eyes:     General: No scleral icterus.    Conjunctiva/sclera:  Conjunctivae normal.     Pupils: Pupils are equal, round, and reactive to light.  Neck:     Musculoskeletal: Normal range of motion and neck supple.     Thyroid: No thyromegaly.  Cardiovascular:     Rate and Rhythm: Normal rate and regular rhythm.     Heart sounds: Normal heart sounds.  Pulmonary:     Effort: Pulmonary effort is normal. No respiratory distress.     Breath sounds: Normal breath sounds.  Abdominal:     General: Bowel sounds are normal.     Palpations: Abdomen is soft. There is no mass.     Tenderness: There is no abdominal tenderness.  Genitourinary:    Comments: Exam done through health department Musculoskeletal: Normal range of motion.        General: No tenderness.  Lymphadenopathy:     Cervical: No cervical adenopathy.  Skin:    General: Skin is warm and dry.     Findings: No rash.  Neurological:     Mental Status: She is alert and oriented to person, place, and time.     Cranial Nerves: No cranial nerve deficit.  Psychiatric:        Behavior: Behavior normal.     Comments: Flat affect     Results for orders placed or performed in visit on 04/09/18  CBC with Differential/Platelet  Result Value Ref Range   WBC 7.3 3.4 - 10.8 x10E3/uL   RBC 4.57 3.77 - 5.28 x10E6/uL   Hemoglobin 14.2 11.1 - 15.9 g/dL   Hematocrit 54.0 98.1 - 46.6 %   MCV 92 79 - 97 fL   MCH 31.1 26.6 - 33.0 pg   MCHC 33.9 31.5 - 35.7 g/dL   RDW 19.1 47.8 - 29.5 %   Platelets 330 150 - 450 x10E3/uL   Neutrophils 74 Not Estab. %   Lymphs 19 Not Estab. %   Monocytes 6 Not Estab. %   Eos 1 Not Estab. %   Basos 0 Not Estab. %   Neutrophils Absolute 5.4 1.4 - 7.0  x10E3/uL   Lymphocytes Absolute 1.4 0.7 - 3.1 x10E3/uL   Monocytes Absolute 0.5 0.1 - 0.9 x10E3/uL   EOS (ABSOLUTE) 0.1 0.0 - 0.4 x10E3/uL   Basophils Absolute 0.0 0.0 - 0.2 x10E3/uL   Immature Granulocytes 0 Not Estab. %   Immature Grans (Abs) 0.0 0.0 - 0.1 x10E3/uL  Comprehensive metabolic panel  Result Value Ref Range   Glucose 87 65 - 99 mg/dL   BUN 8 6 - 20 mg/dL   Creatinine, Ser 6.21 0.57 - 1.00 mg/dL   GFR calc non Af Amer 98 >59 mL/min/1.73   GFR calc Af Amer 113 >59 mL/min/1.73   BUN/Creatinine Ratio 10 9 - 23   Sodium 139 134 - 144 mmol/L   Potassium 4.0 3.5 - 5.2 mmol/L   Chloride 102 96 - 106 mmol/L   CO2 21 20 - 29 mmol/L   Calcium 9.0 8.7 - 10.2 mg/dL   Total Protein 7.5 6.0 - 8.5 g/dL   Albumin 4.5 3.5 - 5.5 g/dL   Globulin, Total 3.0 1.5 - 4.5 g/dL   Albumin/Globulin Ratio 1.5 1.2 - 2.2   Bilirubin Total 0.5 0.0 - 1.2 mg/dL   Alkaline Phosphatase 44 39 - 117 IU/L   AST 16 0 - 40 IU/L   ALT 18 0 - 32 IU/L  Lipid Panel w/o Chol/HDL Ratio  Result Value Ref Range   Cholesterol, Total 171 100 - 199 mg/dL  Triglycerides 49 0 - 149 mg/dL   HDL 53 >16>39 mg/dL   VLDL Cholesterol Cal 10 5 - 40 mg/dL   LDL Calculated 109108 (H) 0 - 99 mg/dL  TSH  Result Value Ref Range   TSH 0.742 0.450 - 4.500 uIU/mL  UA/M w/rflx Culture, Routine  Result Value Ref Range   Specific Gravity, UA 1.015 1.005 - 1.030   pH, UA 7.5 5.0 - 7.5   Color, UA Yellow Yellow   Appearance Ur Clear Clear   Leukocytes, UA Negative Negative   Protein, UA Negative Negative/Trace   Glucose, UA Negative Negative   Ketones, UA Negative Negative   RBC, UA Negative Negative   Bilirubin, UA Negative Negative   Urobilinogen, Ur 0.2 0.2 - 1.0 mg/dL   Nitrite, UA Negative Negative  QuantiFERON-TB Gold Plus  Result Value Ref Range   QuantiFERON Incubation WILL FOLLOW    QuantiFERON Criteria WILL FOLLOW    QuantiFERON TB1 Ag Value WILL FOLLOW    QuantiFERON TB2 Ag Value WILL FOLLOW    QuantiFERON Nil  Value WILL FOLLOW    QuantiFERON Mitogen Value WILL FOLLOW    QuantiFERON-TB Gold Plus WILL FOLLOW       Assessment & Plan:   Problem List Items Addressed This Visit      Other   Major depression, recurrent (HCC) - Primary    Increase wellbutrin to 300 mg and monitor for improvement. May also need to add trazodone as she isn't sleeping well. Continue counseling. F/u in one month      Relevant Medications   buPROPion (WELLBUTRIN XL) 300 MG 24 hr tablet    Other Visit Diagnoses    Annual physical exam       Relevant Orders   CBC with Differential/Platelet (Completed)   Comprehensive metabolic panel (Completed)   Lipid Panel w/o Chol/HDL Ratio (Completed)   TSH (Completed)   UA/M w/rflx Culture, Routine (Completed)   Screening for tuberculosis       Needs testing updated   Relevant Orders   QuantiFERON-TB Gold Plus (Completed)       Follow up plan: Return in about 4 weeks (around 05/07/2018) for Mood f/u.   LABORATORY TESTING:  - Pap smear: up to date  IMMUNIZATIONS:   - Tdap: Tetanus vaccination status reviewed: last tetanus booster within 10 years. - Influenza: Refused  PATIENT COUNSELING:   Advised to take 1 mg of folate supplement per day if capable of pregnancy.   Sexuality: Discussed sexually transmitted diseases, partner selection, use of condoms, avoidance of unintended pregnancy  and contraceptive alternatives.   Advised to avoid cigarette smoking.  I discussed with the patient that most people either abstain from alcohol or drink within safe limits (<=14/week and <=4 drinks/occasion for males, <=7/weeks and <= 3 drinks/occasion for females) and that the risk for alcohol disorders and other health effects rises proportionally with the number of drinks per week and how often a drinker exceeds daily limits.  Discussed cessation/primary prevention of drug use and availability of treatment for abuse.   Diet: Encouraged to adjust caloric intake to maintain  or  achieve ideal body weight, to reduce intake of dietary saturated fat and total fat, to limit sodium intake by avoiding high sodium foods and not adding table salt, and to maintain adequate dietary potassium and calcium preferably from fresh fruits, vegetables, and low-fat dairy products.    stressed the importance of regular exercise  Injury prevention: Discussed safety belts, safety helmets, smoke detector, smoking near  bedding or upholstery.   Dental health: Discussed importance of regular tooth brushing, flossing, and dental visits.    NEXT PREVENTATIVE PHYSICAL DUE IN 1 YEAR. Return in about 4 weeks (around 05/07/2018) for Mood f/u.

## 2018-04-11 NOTE — Assessment & Plan Note (Signed)
Increase wellbutrin to 300 mg and monitor for improvement. May also need to add trazodone as she isn't sleeping well. Continue counseling. F/u in one month

## 2018-04-13 LAB — CBC WITH DIFFERENTIAL/PLATELET
BASOS ABS: 0 10*3/uL (ref 0.0–0.2)
Basos: 0 %
EOS (ABSOLUTE): 0.1 10*3/uL (ref 0.0–0.4)
Eos: 1 %
Hematocrit: 41.9 % (ref 34.0–46.6)
Hemoglobin: 14.2 g/dL (ref 11.1–15.9)
Immature Grans (Abs): 0 10*3/uL (ref 0.0–0.1)
Immature Granulocytes: 0 %
LYMPHS ABS: 1.4 10*3/uL (ref 0.7–3.1)
Lymphs: 19 %
MCH: 31.1 pg (ref 26.6–33.0)
MCHC: 33.9 g/dL (ref 31.5–35.7)
MCV: 92 fL (ref 79–97)
MONOS ABS: 0.5 10*3/uL (ref 0.1–0.9)
Monocytes: 6 %
NEUTROS ABS: 5.4 10*3/uL (ref 1.4–7.0)
Neutrophils: 74 %
PLATELETS: 330 10*3/uL (ref 150–450)
RBC: 4.57 x10E6/uL (ref 3.77–5.28)
RDW: 12 % (ref 11.7–15.4)
WBC: 7.3 10*3/uL (ref 3.4–10.8)

## 2018-04-13 LAB — LIPID PANEL W/O CHOL/HDL RATIO
CHOLESTEROL TOTAL: 171 mg/dL (ref 100–199)
HDL: 53 mg/dL (ref >39)
LDL Calculated: 108 mg/dL — ABNORMAL HIGH (ref 0–99)
Triglycerides: 49 mg/dL (ref 0–149)
VLDL CHOLESTEROL CAL: 10 mg/dL (ref 5–40)

## 2018-04-13 LAB — COMPREHENSIVE METABOLIC PANEL
ALK PHOS: 44 IU/L (ref 39–117)
ALT: 18 IU/L (ref 0–32)
AST: 16 IU/L (ref 0–40)
Albumin/Globulin Ratio: 1.5 (ref 1.2–2.2)
Albumin: 4.5 g/dL (ref 3.5–5.5)
BUN/Creatinine Ratio: 10 (ref 9–23)
BUN: 8 mg/dL (ref 6–20)
Bilirubin Total: 0.5 mg/dL (ref 0.0–1.2)
CHLORIDE: 102 mmol/L (ref 96–106)
CO2: 21 mmol/L (ref 20–29)
Calcium: 9 mg/dL (ref 8.7–10.2)
Creatinine, Ser: 0.81 mg/dL (ref 0.57–1.00)
GFR calc Af Amer: 113 mL/min/{1.73_m2} (ref >59)
GFR calc non Af Amer: 98 mL/min/{1.73_m2} (ref >59)
GLUCOSE: 87 mg/dL (ref 65–99)
Globulin, Total: 3 g/dL (ref 1.5–4.5)
Potassium: 4 mmol/L (ref 3.5–5.2)
Sodium: 139 mmol/L (ref 134–144)
Total Protein: 7.5 g/dL (ref 6.0–8.5)

## 2018-04-13 LAB — QUANTIFERON-TB GOLD PLUS
QUANTIFERON NIL VALUE: 0.03 [IU]/mL
QuantiFERON Mitogen Value: >10 IU/mL
QuantiFERON TB1 Ag Value: 0.04 IU/mL
QuantiFERON TB2 Ag Value: 0.03 IU/mL
QuantiFERON-TB Gold Plus: NEGATIVE

## 2018-04-13 LAB — TSH: TSH: 0.742 u[IU]/mL (ref 0.450–4.500)

## 2018-04-21 DIAGNOSIS — F332 Major depressive disorder, recurrent severe without psychotic features: Secondary | ICD-10-CM | POA: Diagnosis not present

## 2018-04-28 ENCOUNTER — Encounter (HOSPITAL_COMMUNITY): Payer: Self-pay

## 2018-04-28 ENCOUNTER — Ambulatory Visit (HOSPITAL_COMMUNITY)
Admission: EM | Admit: 2018-04-28 | Discharge: 2018-04-28 | Disposition: A | Discharge status: Home / Self Care | Payer: Medicaid Other | Attending: Family Medicine | Admitting: Family Medicine

## 2018-04-28 DIAGNOSIS — F410 Panic disorder [episodic paroxysmal anxiety] without agoraphobia: Secondary | ICD-10-CM | POA: Diagnosis not present

## 2018-04-28 DIAGNOSIS — F332 Major depressive disorder, recurrent severe without psychotic features: Secondary | ICD-10-CM | POA: Diagnosis not present

## 2018-04-28 NOTE — ED Provider Notes (Signed)
[facility]   [id number] 04/28/18 Arrival Time: [id number]  ASSESSMENT & PLAN:  1. Panic attack    No current symptoms. Uncertain prognosis given these have just started. Has counselor and is scheduling regular f/u with PCP. Will hold of on trial of benzodiazepines. Discussed. No SI/HI at this time.  Follow-up Information    Schedule an appointment as soon as possible for a visit  with Particia Townsend, [name], PA-C.   Specialty:  Family Medicine Contact information: [address] [place] [id number] [phone]          Agrees to return or proceed to the ED with any abrupt worsening.  Reviewed expectations re: course of current medical issues. Questions answered. Outlined signs and symptoms indicating need for more acute intervention. Patient verbalized understanding. After Visit Summary given.   SUBJECTIVE:  Sandy Townsend is a 33 y.o. female who presents with complaint of: likely panic attacks. Over the past 3-4 days; mostly qd; describes chest tightness with mild SOB; palpitations; typical episode with about 2 minutes of significant symptoms; resolves after 10 minutes. Reports Wellbutrin dose recently increased 1/10; questions relation. No triggers identified; + stress lately -- daughter recently hospitalized; full time school. Is seeing a Veterinary surgeoncounselor. No SI/HI today. No illicit drug use or heavy alcohol use.  Social History   Tobacco Use  Smoking Status Never Smoker  Smokeless Tobacco Never Used   ROS: As per HPI. All other systems negative    OBJECTIVE:  Vitals:   04/28/18 0938  BP: (!) 139/96  Pulse: (!) 101  Resp: 18  Temp: 98 F (36.7 C)  TempSrc: Oral  SpO2: 100%    Recheck pulse: 94  General appearance: alert; no distress Eyes: PERRLA; EOMI; conjunctiva normal HENT: normocephalic; atraumatic Neck: supple Lungs: clear to auscultation bilaterally Heart: regular rate and rhythm Abdomen: soft, non-tender  Extremities: no cyanosis or edema;  symmetrical with no gross deformities Skin: warm and dry Neurologic: normal gait; normal symmetric reflexes Psychological: alert and cooperative; normal mood and affect  Labs: Results for orders placed or performed in visit on 04/09/18  CBC with Differential/Platelet  Result Value Ref Range   WBC 7.3 3.4 - 10.8 x10E3/uL   RBC 4.57 3.77 - 5.28 x10E6/uL   Hemoglobin 14.2 11.1 - 15.9 g/dL   Hematocrit 44.041.9 10.234.0 - 46.6 %   MCV 92 79 - 97 fL   MCH 31.1 26.6 - 33.0 pg   MCHC 33.9 31.5 - 35.7 g/dL   RDW 72.512.0 36.611.7 - 44.015.4 %   Platelets 330 150 - 450 x10E3/uL   Neutrophils 74 Not Estab. %   Lymphs 19 Not Estab. %   Monocytes 6 Not Estab. %   Eos 1 Not Estab. %   Basos 0 Not Estab. %   Neutrophils Absolute 5.4 1.4 - 7.0 x10E3/uL   Lymphocytes Absolute 1.4 0.7 - 3.1 x10E3/uL   Monocytes Absolute 0.5 0.1 - 0.9 x10E3/uL   EOS (ABSOLUTE) 0.1 0.0 - 0.4 x10E3/uL   Basophils Absolute 0.0 0.0 - 0.2 x10E3/uL   Immature Granulocytes 0 Not Estab. %   Immature Grans (Abs) 0.0 0.0 - 0.1 x10E3/uL  Comprehensive metabolic panel  Result Value Ref Range   Glucose 87 65 - 99 mg/dL   BUN 8 6 - 20 mg/dL   Creatinine, Ser 3.470.81 0.57 - 1.00 mg/dL   GFR calc non Af Amer 98 >59 mL/min/1.73   GFR calc Af Amer 113 >59 mL/min/1.73   BUN/Creatinine Ratio 10 9 - 23  Sodium 139 134 - 144 mmol/L   Potassium 4.0 3.5 - 5.2 mmol/L   Chloride 102 96 - 106 mmol/L   CO2 21 20 - 29 mmol/L   Calcium 9.0 8.7 - 10.2 mg/dL   Total Protein 7.5 6.0 - 8.5 g/dL   Albumin 4.5 3.5 - 5.5 g/dL   Globulin, Total 3.0 1.5 - 4.5 g/dL   Albumin/Globulin Ratio 1.5 1.2 - 2.2   Bilirubin Total 0.5 0.0 - 1.2 mg/dL   Alkaline Phosphatase 44 39 - 117 IU/L   AST 16 0 - 40 IU/L   ALT 18 0 - 32 IU/L  Lipid Panel w/o Chol/HDL Ratio  Result Value Ref Range   Cholesterol, Total 171 100 - 199 mg/dL   Triglycerides 49 0 - 149 mg/dL   HDL 53 >19 mg/dL   VLDL Cholesterol Cal 10 5 - 40 mg/dL   LDL Calculated 379 (H) 0 - 99 mg/dL  TSH    Result Value Ref Range   TSH 0.742 0.450 - 4.500 uIU/mL  UA/M w/rflx Culture, Routine  Result Value Ref Range   Specific Gravity, UA 1.015 1.005 - 1.030   pH, UA 7.5 5.0 - 7.5   Color, UA Yellow Yellow   Appearance Ur Clear Clear   Leukocytes, UA Negative Negative   Protein, UA Negative Negative/Trace   Glucose, UA Negative Negative   Ketones, UA Negative Negative   RBC, UA Negative Negative   Bilirubin, UA Negative Negative   Urobilinogen, Ur 0.2 0.2 - 1.0 mg/dL   Nitrite, UA Negative Negative  QuantiFERON-TB Gold Plus  Result Value Ref Range   QuantiFERON Incubation Incubation performed.    QuantiFERON Criteria Comment    QuantiFERON TB1 Ag Value 0.04 IU/mL   QuantiFERON TB2 Ag Value 0.03 IU/mL   QuantiFERON Nil Value 0.03 IU/mL   QuantiFERON Mitogen Value >10.00 IU/mL   QuantiFERON-TB Gold Plus Negative Negative   Labs Reviewed - No data to display   Allergies  Allergen Reactions  . Iodine Anaphylaxis  . Shellfish Allergy Anaphylaxis and Swelling  . Pollen Extract Itching    Past Medical History:  Diagnosis Date  . Exposure to TB   . Heart murmur    Social History   Socioeconomic History  . Marital status: Single    Spouse name: Not on file  . Number of children: Not on file  . Years of education: Not on file  . Highest education level: Not on file  Occupational History  . Not on file  Social Needs  . Financial resource strain: Not on file  . Food insecurity:    Worry: Not on file    Inability: Not on file  . Transportation needs:    Medical: Not on file    Non-medical: Not on file  Tobacco Use  . Smoking status: Never Smoker  . Smokeless tobacco: Never Used  Substance and Sexual Activity  . Alcohol use: Yes    Alcohol/week: 2.0 standard drinks    Types: 2 Glasses of wine per week  . Drug use: Never  . Sexual activity: Yes    Birth control/protection: Pill  Lifestyle  . Physical activity:    Days per week: Not on file    Minutes per  session: Not on file  . Stress: Not on file  Relationships  . Social connections:    Talks on phone: Not on file    Gets together: Not on file    Attends religious service: Not on file  Active member of club or organization: Not on file    Attends meetings of clubs or organizations: Not on file    Relationship status: Not on file  . Intimate partner violence:    Fear of current or ex partner: Not on file    Emotionally abused: Not on file    Physically abused: Not on file    Forced sexual activity: Not on file  Other Topics Concern  . Not on file  Social History Narrative  . Not on file   Family History  Problem Relation Age of Onset  . Diabetes Mother   . Hypercalcemia Mother   . Hypertension Mother   . Hypertension Father   . Diabetes Paternal Grandmother   . Diabetes Paternal Grandfather    History reviewed. No pertinent surgical history.    Mardella Layman, MD 05/01/18 0930

## 2018-04-28 NOTE — ED Triage Notes (Signed)
Pt c/o having spells of anxiety with breathing fast, SOB, chest tightness and palpitations, episodes lasting 2-64mins. States is now seeing a Veterinary surgeon for this. States yesterday she may have blacked out for a few seconds. States is on meds but not sure if that is the problem.

## 2018-04-28 NOTE — Discharge Instructions (Addendum)
Be aware, medications prescribed today may cause drowsiness. Please do not drive, operate heavy machinery or make important decisions while on this medication, it can cloud your judgement.

## 2018-05-12 ENCOUNTER — Other Ambulatory Visit: Payer: Self-pay | Admitting: Family Medicine

## 2018-05-12 DIAGNOSIS — F332 Major depressive disorder, recurrent severe without psychotic features: Secondary | ICD-10-CM | POA: Diagnosis not present

## 2018-05-12 NOTE — Telephone Encounter (Signed)
BP normal at office check- elevated during panic attack at ED. Patient has appointment 07/28/18- will refill until then Requested Prescriptions  Pending Prescriptions Disp Refills  . amLODipine (NORVASC) 5 MG tablet [Pharmacy Med Name: AMLODIPINE BESYLATE 5MG  TABLETS] 30 tablet 1    Sig: TAKE 1 TABLET(5 MG) BY MOUTH DAILY     Cardiovascular:  Calcium Channel Blockers Failed - 05/12/2018  2:09 PM      Failed - Last BP in normal range    BP Readings from Last 1 Encounters:  04/28/18 (!) 139/96         Passed - Valid encounter within last 6 months    Recent Outpatient Visits          1 month ago Moderate episode of recurrent major depressive disorder [facility])   [facility] Becenti, [place], PA-C   2 months ago Elevated blood-pressure reading without diagnosis of hypertension   [facility] Particia Nearing, [place]   3 months ago Moderate episode of recurrent major depressive disorder [facility])   [facility] Roosvelt Maser [place], [place]   4 months ago Moderate episode of recurrent major depressive disorder [facility])   [facility] Particia Nearing, [place]      Future Appointments            In 2 months Maurice March, Salley Hews, PA-C [facility]

## 2018-05-26 DIAGNOSIS — F332 Major depressive disorder, recurrent severe without psychotic features: Secondary | ICD-10-CM | POA: Diagnosis not present

## 2018-05-31 DIAGNOSIS — H16012 Central corneal ulcer, left eye: Secondary | ICD-10-CM | POA: Diagnosis not present

## 2018-06-09 DIAGNOSIS — F332 Major depressive disorder, recurrent severe without psychotic features: Secondary | ICD-10-CM | POA: Diagnosis not present

## 2018-06-23 DIAGNOSIS — F332 Major depressive disorder, recurrent severe without psychotic features: Secondary | ICD-10-CM | POA: Diagnosis not present

## 2018-07-05 DIAGNOSIS — F332 Major depressive disorder, recurrent severe without psychotic features: Secondary | ICD-10-CM | POA: Diagnosis not present

## 2018-07-06 ENCOUNTER — Other Ambulatory Visit: Payer: Self-pay | Admitting: Family Medicine

## 2018-07-06 ENCOUNTER — Telehealth: Payer: Self-pay | Admitting: Family Medicine

## 2018-07-06 NOTE — Telephone Encounter (Signed)
Requested Prescriptions  Pending Prescriptions Disp Refills  . buPROPion (WELLBUTRIN XL) 300 MG 24 hr tablet [Pharmacy Med Name: BUPROPION XL 300MG  TABLETS] 30 tablet 0    Sig: TAKE 1 TABLET(300 MG) BY MOUTH DAILY     Psychiatry: Antidepressants - bupropion Failed - 07/06/2018  6:13 AM      Failed - Last BP in normal range    BP Readings from Last 1 Encounters:  04/28/18 (!) 139/96         Passed - Completed PHQ-2 or PHQ-9 in the last 360 days.      Passed - Valid encounter within last 6 months    Recent Outpatient Visits          2 months ago Moderate episode of recurrent major depressive disorder [facility])   [facility] [place], [place], [place]   4 months ago Elevated blood-pressure reading without diagnosis of hypertension   [facility] Roosvelt Maser [place], [place]   5 months ago Moderate episode of recurrent major depressive disorder [facility])   [facility] Roosvelt Maser [place], [place]   6 months ago Moderate episode of recurrent major depressive disorder [facility])   [facility] Particia Nearing, [place]      Future Appointments            In 3 weeks Maurice March, Salley Hews, PA-C [facility]

## 2018-07-06 NOTE — Telephone Encounter (Signed)
LVM for patient to schedule virtual visit for allergies with Fleet Contras

## 2018-07-06 NOTE — Telephone Encounter (Signed)
Yes if she would like to be seen for a visit

## 2018-07-06 NOTE — Telephone Encounter (Signed)
LVM xs 2 regarding making virtual visit regarding patients allergies

## 2018-07-06 NOTE — Telephone Encounter (Signed)
Patient feels she is having allergy symptoms due to the pollen, had to pull out her inhaler.  No fever just symptoms of her allergies.   Do you want me to do a virtual appt for her?  Thank you

## 2018-07-07 ENCOUNTER — Encounter: Payer: Self-pay | Admitting: Family Medicine

## 2018-07-07 ENCOUNTER — Other Ambulatory Visit: Payer: Self-pay

## 2018-07-07 ENCOUNTER — Ambulatory Visit (INDEPENDENT_AMBULATORY_CARE_PROVIDER_SITE_OTHER): Payer: Medicaid Other | Admitting: Family Medicine

## 2018-07-07 VITALS — Wt 168.0 lb

## 2018-07-07 DIAGNOSIS — I10 Essential (primary) hypertension: Secondary | ICD-10-CM | POA: Diagnosis not present

## 2018-07-07 DIAGNOSIS — J301 Allergic rhinitis due to pollen: Secondary | ICD-10-CM

## 2018-07-07 DIAGNOSIS — F331 Major depressive disorder, recurrent, moderate: Secondary | ICD-10-CM | POA: Diagnosis not present

## 2018-07-07 DIAGNOSIS — Z308 Encounter for other contraceptive management: Secondary | ICD-10-CM

## 2018-07-07 MED ORDER — CETIRIZINE HCL 10 MG PO TABS: 10.0000 mg | ORAL_TABLET | Freq: Every day | ORAL | 11 refills | Status: DC

## 2018-07-07 MED ORDER — FLUTICASONE PROPIONATE 50 MCG/ACT NA SUSP: 1.0000 | Freq: Two times a day (BID) | NASAL | 6 refills | Status: DC

## 2018-07-07 MED ORDER — ALBUTEROL SULFATE HFA 108 (90 BASE) MCG/ACT IN AERS: 2.0000 | INHALATION_SPRAY | Freq: Four times a day (QID) | RESPIRATORY_TRACT | 0 refills | Status: DC | PRN

## 2018-07-07 MED ORDER — NORLYDA 0.35 MG PO TABS: 1.0000 | ORAL_TABLET | Freq: Every day | ORAL | 6 refills | Status: DC

## 2018-07-07 MED ORDER — BUPROPION HCL ER (XL) 300 MG PO TB24: 300.0000 mg | ORAL_TABLET | Freq: Every day | ORAL | 1 refills | Status: DC

## 2018-07-07 NOTE — Progress Notes (Signed)
Wt 168 lb (76.2 kg)   LMP 06/20/2018   BMI 30.73 kg/m    Subjective:    Patient ID: Sandy Townsend, female    DOB: May 16, 1985, 32 y.o.   MRN: [id number]  HPI: Sandy Townsend is a 33 y.o. female  Chief Complaint  Patient presents with  . Allergies    . This visit was completed via WebEx due to the restrictions of the COVID-19 pandemic. All issues as above were discussed and addressed. Physical exam was done as above through visual confirmation on WebEx. If it was felt that the patient should be evaluated in the office, they were directed there. The patient verbally consented to this visit. . Location of the patient: home . Location of the provider: home . Those involved with this call:  . Provider: Roosvelt Maser, PA-C . CMA: Myrtha Mantis, CMA . Front Desk/Registration: Harriet Pho  . Time spent on call: 24 minutes with patient face to face via video conference. More than 50% of this time was spent in counseling and coordination of care.   Having significant issues with eye swelling, sneezing,discharge, coughing and wheezing, congestion as soon as she gets outside with all the pollen the past few weeks. Has been trying benadryl at bedtime. Denies fevers, chills, body aches, CP, SOB, sick contacts, recent travel.   Doing much better on the wellbutrin with the dose increased to 300 mg. Only struggles if she forgets to take it. When taking it consistently, feeling very level and calm. Still seeing therapist via televisit once monthly. Denies SI/HI, side effects, severe mood swings.   Still taking the amlodipine 5 mg, BPs running normal. Getting readings of 120s-130s/70s-80s at home. No side effects, Cp, SOB, dizziness, HAs. Trying to eat well and stay active.   Relevant past medical, surgical, family and social history reviewed and updated as indicated. Interim medical history since our last visit reviewed. Allergies and medications reviewed and updated.  Review of Systems  Per HPI  unless specifically indicated above     Objective:    Wt 168 lb (76.2 kg)   LMP 06/20/2018   BMI 30.73 kg/m   Wt Readings from Last 3 Encounters:  07/07/18 168 lb (76.2 kg)  04/09/18 172 lb (78 kg)  03/01/18 170 lb (77.1 kg)    Physical Exam Vitals signs and nursing note reviewed.  Constitutional:      General: She is not in acute distress.    Appearance: Normal appearance.  HENT:     Head: Atraumatic.     Right Ear: External ear normal.     Left Ear: External ear normal.     Nose: Rhinorrhea present. No congestion.     Mouth/Throat:     Mouth: Mucous membranes are moist.     Pharynx: Oropharynx is clear. Posterior oropharyngeal erythema present.  Eyes:     Extraocular Movements: Extraocular movements intact.     Conjunctiva/sclera: Conjunctivae normal.  Neck:     Musculoskeletal: Normal range of motion.  Cardiovascular:     Comments: Unable to assess via virtual visit Pulmonary:     Effort: Pulmonary effort is normal. No respiratory distress.  Musculoskeletal: Normal range of motion.  Skin:    General: Skin is dry.     Findings: No erythema.  Neurological:     Mental Status: She is alert and oriented to person, place, and time.  Psychiatric:        Mood and Affect: Mood normal.  Thought Content: Thought content normal.        Judgment: Judgment normal.     Results for orders placed or performed in visit on 04/09/18  CBC with Differential/Platelet  Result Value Ref Range   WBC 7.3 3.4 - 10.8 x10E3/uL   RBC 4.57 3.77 - 5.28 x10E6/uL   Hemoglobin 14.2 11.1 - 15.9 g/dL   Hematocrit 16.141.9 09.634.0 - 46.6 %   MCV 92 79 - 97 fL   MCH 31.1 26.6 - 33.0 pg   MCHC 33.9 31.5 - 35.7 g/dL   RDW 04.512.0 40.911.7 - 81.115.4 %   Platelets 330 150 - 450 x10E3/uL   Neutrophils 74 Not Estab. %   Lymphs 19 Not Estab. %   Monocytes 6 Not Estab. %   Eos 1 Not Estab. %   Basos 0 Not Estab. %   Neutrophils Absolute 5.4 1.4 - 7.0 x10E3/uL   Lymphocytes Absolute 1.4 0.7 - 3.1 x10E3/uL    Monocytes Absolute 0.5 0.1 - 0.9 x10E3/uL   EOS (ABSOLUTE) 0.1 0.0 - 0.4 x10E3/uL   Basophils Absolute 0.0 0.0 - 0.2 x10E3/uL   Immature Granulocytes 0 Not Estab. %   Immature Grans (Abs) 0.0 0.0 - 0.1 x10E3/uL  Comprehensive metabolic panel  Result Value Ref Range   Glucose 87 65 - 99 mg/dL   BUN 8 6 - 20 mg/dL   Creatinine, Ser 9.140.81 0.57 - 1.00 mg/dL   GFR calc non Af Amer 98 >59 mL/min/1.73   GFR calc Af Amer 113 >59 mL/min/1.73   BUN/Creatinine Ratio 10 9 - 23   Sodium 139 134 - 144 mmol/L   Potassium 4.0 3.5 - 5.2 mmol/L   Chloride 102 96 - 106 mmol/L   CO2 21 20 - 29 mmol/L   Calcium 9.0 8.7 - 10.2 mg/dL   Total Protein 7.5 6.0 - 8.5 g/dL   Albumin 4.5 3.5 - 5.5 g/dL   Globulin, Total 3.0 1.5 - 4.5 g/dL   Albumin/Globulin Ratio 1.5 1.2 - 2.2   Bilirubin Total 0.5 0.0 - 1.2 mg/dL   Alkaline Phosphatase 44 39 - 117 IU/L   AST 16 0 - 40 IU/L   ALT 18 0 - 32 IU/L  Lipid Panel w/o Chol/HDL Ratio  Result Value Ref Range   Cholesterol, Total 171 100 - 199 mg/dL   Triglycerides 49 0 - 149 mg/dL   HDL 53 >78>39 mg/dL   VLDL Cholesterol Cal 10 5 - 40 mg/dL   LDL Calculated 295108 (H) 0 - 99 mg/dL  TSH  Result Value Ref Range   TSH 0.742 0.450 - 4.500 uIU/mL  UA/M w/rflx Culture, Routine  Result Value Ref Range   Specific Gravity, UA 1.015 1.005 - 1.030   pH, UA 7.5 5.0 - 7.5   Color, UA Yellow Yellow   Appearance Ur Clear Clear   Leukocytes, UA Negative Negative   Protein, UA Negative Negative/Trace   Glucose, UA Negative Negative   Ketones, UA Negative Negative   RBC, UA Negative Negative   Bilirubin, UA Negative Negative   Urobilinogen, Ur 0.2 0.2 - 1.0 mg/dL   Nitrite, UA Negative Negative  QuantiFERON-TB Gold Plus  Result Value Ref Range   QuantiFERON Incubation Incubation performed.    QuantiFERON Criteria Comment    QuantiFERON TB1 Ag Value 0.04 IU/mL   QuantiFERON TB2 Ag Value 0.03 IU/mL   QuantiFERON Nil Value 0.03 IU/mL   QuantiFERON Mitogen Value >10.00  IU/mL   QuantiFERON-TB Gold Plus Negative Negative  Assessment & Plan:   Problem List Items Addressed This Visit      Cardiovascular and Mediastinum   Essential hypertension    Stable and under good control, continue current regimen        Respiratory   Allergic rhinitis    Start zyrtec and flonase regimen, supportive care reviewed. F/u if sxs not improving or worsening        Other   Major depression, recurrent (HCC) - Primary    Stable and under good control with wellbutrin at 300 mg. Continue current regimen      Relevant Medications   buPROPion (WELLBUTRIN XL) 300 MG 24 hr tablet   Contraception management    Doing well on norlyda, taking faithfully. Needing refill. Has not missed any doses          Follow up plan: Return in about 6 months (around 01/06/2019) for 6 month f/u.

## 2018-07-08 ENCOUNTER — Telehealth: Payer: Self-pay | Admitting: Family Medicine

## 2018-07-08 NOTE — Telephone Encounter (Signed)
Called pt to set up 6 month fu no answer, left voicemail.

## 2018-07-09 DIAGNOSIS — J309 Allergic rhinitis, unspecified: Secondary | ICD-10-CM | POA: Insufficient documentation

## 2018-07-09 DIAGNOSIS — I10 Essential (primary) hypertension: Secondary | ICD-10-CM | POA: Insufficient documentation

## 2018-07-09 DIAGNOSIS — Z309 Encounter for contraceptive management, unspecified: Secondary | ICD-10-CM | POA: Insufficient documentation

## 2018-07-09 NOTE — Assessment & Plan Note (Signed)
Start zyrtec and flonase regimen, supportive care reviewed. F/u if sxs not improving or worsening

## 2018-07-09 NOTE — Assessment & Plan Note (Signed)
Stable and under good control, continue current regimen 

## 2018-07-09 NOTE — Assessment & Plan Note (Signed)
Doing well on norlyda, taking faithfully. Needing refill. Has not missed any doses

## 2018-07-09 NOTE — Assessment & Plan Note (Signed)
Stable and under good control with wellbutrin at 300 mg. Continue current regimen

## 2018-07-19 DIAGNOSIS — F332 Major depressive disorder, recurrent severe without psychotic features: Secondary | ICD-10-CM | POA: Diagnosis not present

## 2018-07-26 ENCOUNTER — Telehealth: Payer: Self-pay | Admitting: Family Medicine

## 2018-07-26 NOTE — Telephone Encounter (Signed)
Called pt to set up virtual appt for Wednesday no answer, left voicemail.

## 2018-07-28 ENCOUNTER — Ambulatory Visit: Payer: Medicaid Other | Admitting: Family Medicine

## 2018-07-28 ENCOUNTER — Other Ambulatory Visit: Payer: Self-pay

## 2018-08-02 ENCOUNTER — Ambulatory Visit (INDEPENDENT_AMBULATORY_CARE_PROVIDER_SITE_OTHER): Payer: Medicaid Other | Admitting: Family Medicine

## 2018-08-02 ENCOUNTER — Other Ambulatory Visit: Payer: Self-pay

## 2018-08-02 ENCOUNTER — Encounter: Payer: Self-pay | Admitting: Family Medicine

## 2018-08-02 DIAGNOSIS — I10 Essential (primary) hypertension: Secondary | ICD-10-CM | POA: Diagnosis not present

## 2018-08-02 DIAGNOSIS — F331 Major depressive disorder, recurrent, moderate: Secondary | ICD-10-CM | POA: Diagnosis not present

## 2018-08-02 DIAGNOSIS — F332 Major depressive disorder, recurrent severe without psychotic features: Secondary | ICD-10-CM | POA: Diagnosis not present

## 2018-08-02 NOTE — Progress Notes (Signed)
There were no vitals taken for this visit.   Subjective:    Patient ID: Sandy Townsend, female    DOB: [birth year], 33 y.o.   MRN: [id number]  HPI: Sandy Townsend is a 33 y.o. female  Chief Complaint  Patient presents with  . Follow-up  . Depression    . This visit was completed via WebEx due to the restrictions of the COVID-19 pandemic. All issues as above were discussed and addressed. Physical exam was done as above through visual confirmation on WebEx. If it was felt that the patient should be evaluated in the office, they were directed there. The patient verbally consented to this visit. . Location of the patient: home . Location of the provider: home . Those involved with this call:  . Provider: Roosvelt Maser, PA-C . CMA: Sheilah Mins, CMA . Front Desk/Registration: Harriet Pho  . Time spent on call: 15 minutes with patient face to face via video conference. More than 50% of this time was spent in counseling and coordination of care. 5 minutes total spent in review of patient's record and preparation of their chart. I verified patient identity using two factors (patient name and date of birth). Patient consents verbally to being seen via telemedicine visit today.   Patient here today for 6 month f/u BP and depression/anxiety. Doing very well on current medications, no new concerns.   Has not been checking home BPs lately, but the past few months readings at the pharmacy have always been 120s-130s/70s. Denies CP, SOB, HAs, dizziness. Taking her medicine faithfully.   Wellbutrin continues to work very well for her moods and anxiety. Also recently graduated from college the past week which seems to have taken a significant amount of her stress away. Denies SI/HI, panic episodes, sleep or appetite issues.   Depression screen [facility] 2/9 08/02/2018 04/09/2018 01/27/2018  Decreased Interest 0 1 0  Down, Depressed, Hopeless 1 1 0  PHQ - 2 Score 1 2 0  Altered sleeping Tired, decreased  energy Change in appetite 0 0 1  Feeling bad or failure about yourself  0 1 0  Trouble concentrating 0 0 0  Moving slowly or fidgety/restless 0 1 1  Suicidal thoughts 0 0 0  PHQ-9 Score Difficult doing work/chores Not difficult at all - -    Relevant past medical, surgical, family and social history reviewed and updated as indicated. Interim medical history since our last visit reviewed. Allergies and medications reviewed and updated.  Review of Systems  Per HPI unless specifically indicated above     Objective:    There were no vitals taken for this visit.  Wt Readings from Last 3 Encounters:  07/07/18 168 lb (76.2 kg)  04/09/18 172 lb (78 kg)  03/01/18 170 lb (77.1 kg)    Physical Exam Vitals signs and nursing note reviewed.  Constitutional:      General: She is not in acute distress.    Appearance: Normal appearance.  HENT:     Head: Atraumatic.     Right Ear: External ear normal.     Left Ear: External ear normal.     Nose: Nose normal. No congestion.     Mouth/Throat:     Mouth: Mucous membranes are moist.     Pharynx: Oropharynx is clear. No posterior oropharyngeal erythema.  Eyes:     Extraocular Movements: Extraocular movements intact.     Conjunctiva/sclera: Conjunctivae normal.  Neck:  Musculoskeletal: Normal range of motion.  Cardiovascular:     Comments: Unable to assess via virtual visit Pulmonary:     Effort: Pulmonary effort is normal. No respiratory distress.  Musculoskeletal: Normal range of motion.  Skin:    General: Skin is dry.     Findings: No erythema.  Neurological:     Mental Status: She is alert and oriented to person, place, and time.  Psychiatric:        Mood and Affect: Mood normal.        Thought Content: Thought content normal.        Judgment: Judgment normal.     Results for orders placed or performed in visit on 04/09/18  CBC with Differential/Platelet  Result Value Ref Range   WBC 7.3 3.4 - 10.8  x10E3/uL   RBC 4.57 3.77 - 5.28 x10E6/uL   Hemoglobin 14.2 11.1 - 15.9 g/dL   Hematocrit 28.4 13.2 - 46.6 %   MCV 92 79 - 97 fL   MCH 31.1 26.6 - 33.0 pg   MCHC 33.9 31.5 - 35.7 g/dL   RDW 44.0 10.2 - 72.5 %   Platelets 330 150 - 450 x10E3/uL   Neutrophils 74 Not Estab. %   Lymphs 19 Not Estab. %   Monocytes 6 Not Estab. %   Eos 1 Not Estab. %   Basos 0 Not Estab. %   Neutrophils Absolute 5.4 1.4 - 7.0 x10E3/uL   Lymphocytes Absolute 1.4 0.7 - 3.1 x10E3/uL   Monocytes Absolute 0.5 0.1 - 0.9 x10E3/uL   EOS (ABSOLUTE) 0.1 0.0 - 0.4 x10E3/uL   Basophils Absolute 0.0 0.0 - 0.2 x10E3/uL   Immature Granulocytes 0 Not Estab. %   Immature Grans (Abs) 0.0 0.0 - 0.1 x10E3/uL  Comprehensive metabolic panel  Result Value Ref Range   Glucose 87 65 - 99 mg/dL   BUN 8 6 - 20 mg/dL   Creatinine, Ser 3.66 0.57 - 1.00 mg/dL   GFR calc non Af Amer 98 >59 mL/min/1.73   GFR calc Af Amer 113 >59 mL/min/1.73   BUN/Creatinine Ratio 10 9 - 23   Sodium 139 134 - 144 mmol/L   Potassium 4.0 3.5 - 5.2 mmol/L   Chloride 102 96 - 106 mmol/L   CO2 21 20 - 29 mmol/L   Calcium 9.0 8.7 - 10.2 mg/dL   Total Protein 7.5 6.0 - 8.5 g/dL   Albumin 4.5 3.5 - 5.5 g/dL   Globulin, Total 3.0 1.5 - 4.5 g/dL   Albumin/Globulin Ratio 1.5 1.2 - 2.2   Bilirubin Total 0.5 0.0 - 1.2 mg/dL   Alkaline Phosphatase 44 39 - 117 IU/L   AST 16 0 - 40 IU/L   ALT 18 0 - 32 IU/L  Lipid Panel w/o Chol/HDL Ratio  Result Value Ref Range   Cholesterol, Total 171 100 - 199 mg/dL   Triglycerides 49 0 - 149 mg/dL   HDL 53 >44 mg/dL   VLDL Cholesterol Cal 10 5 - 40 mg/dL   LDL Calculated 034 (H) 0 - 99 mg/dL  TSH  Result Value Ref Range   TSH 0.742 0.450 - 4.500 uIU/mL  UA/M w/rflx Culture, Routine  Result Value Ref Range   Specific Gravity, UA 1.015 1.005 - 1.030   pH, UA 7.5 5.0 - 7.5   Color, UA Yellow Yellow   Appearance Ur Clear Clear   Leukocytes, UA Negative Negative   Protein, UA Negative Negative/Trace   Glucose, UA  Negative Negative   Ketones,  UA Negative Negative   RBC, UA Negative Negative   Bilirubin, UA Negative Negative   Urobilinogen, Ur 0.2 0.2 - 1.0 mg/dL   Nitrite, UA Negative Negative  QuantiFERON-TB Gold Plus  Result Value Ref Range   QuantiFERON Incubation Incubation performed.    QuantiFERON Criteria Comment    QuantiFERON TB1 Ag Value 0.04 IU/mL   QuantiFERON TB2 Ag Value 0.03 IU/mL   QuantiFERON Nil Value 0.03 IU/mL   QuantiFERON Mitogen Value >10.00 IU/mL   QuantiFERON-TB Gold Plus Negative Negative      Assessment & Plan:   Problem List Items Addressed This Visit      Cardiovascular and Mediastinum   Essential hypertension - Primary    Stable, WNL when checked. Recommended getting home monitor to keep logs. Call with persistent abnormal readings or side effects. Continue current regimen, weight loss, diet        Other   Major depression, recurrent (HCC)    Under excellent control on wellbutrin. Continue current regimen          Follow up plan: Return in about 6 months (around 02/02/2019) for CPE.

## 2018-08-05 ENCOUNTER — Other Ambulatory Visit: Payer: Self-pay | Admitting: Family Medicine

## 2018-08-05 NOTE — Telephone Encounter (Signed)
Requested Prescriptions  Pending Prescriptions Disp Refills  . amLODipine (NORVASC) 5 MG tablet [Pharmacy Med Name: AMLODIPINE BESYLATE 5MG  TABLETS] 90 tablet 1    Sig: TAKE 1 TABLET(5 MG) BY MOUTH DAILY     Cardiovascular:  Calcium Channel Blockers Failed - 08/05/2018  9:50 AM      Failed - Last BP in normal range    BP Readings from Last 1 Encounters:  04/28/18 (!) 139/96         Passed - Valid encounter within last 6 months    Recent Outpatient Visits          3 days ago Essential hypertension   [facility] Roosvelt Maser [place], [place]   4 weeks ago Moderate episode of recurrent major depressive disorder [facility])   [facility] Roosvelt Maser [place], [place]   3 months ago Moderate episode of recurrent major depressive disorder [facility])   [facility] [place], [place], [place]   5 months ago Elevated blood-pressure reading without diagnosis of hypertension   [facility] Roosvelt Maser [place], [place]   6 months ago Moderate episode of recurrent major depressive disorder [facility])   [facility] Particia [place], [place]

## 2018-08-05 NOTE — Assessment & Plan Note (Signed)
Under excellent control on wellbutrin. Continue current regimen

## 2018-08-05 NOTE — Assessment & Plan Note (Signed)
Stable, WNL when checked. Recommended getting home monitor to keep logs. Call with persistent abnormal readings or side effects. Continue current regimen, weight loss, diet

## 2018-08-16 DIAGNOSIS — F332 Major depressive disorder, recurrent severe without psychotic features: Secondary | ICD-10-CM | POA: Diagnosis not present

## 2018-09-16 DIAGNOSIS — H16223 Keratoconjunctivitis sicca, not specified as Sjogren's, bilateral: Secondary | ICD-10-CM | POA: Diagnosis not present

## 2018-09-16 DIAGNOSIS — H40033 Anatomical narrow angle, bilateral: Secondary | ICD-10-CM | POA: Diagnosis not present

## 2018-09-29 DIAGNOSIS — F332 Major depressive disorder, recurrent severe without psychotic features: Secondary | ICD-10-CM | POA: Diagnosis not present

## 2018-11-08 DIAGNOSIS — F332 Major depressive disorder, recurrent severe without psychotic features: Secondary | ICD-10-CM | POA: Diagnosis not present

## 2018-11-10 ENCOUNTER — Other Ambulatory Visit: Payer: Self-pay

## 2018-11-10 ENCOUNTER — Ambulatory Visit: Payer: Medicaid Other | Admitting: Nurse Practitioner

## 2018-11-10 DIAGNOSIS — Z20822 Contact with and (suspected) exposure to covid-19: Secondary | ICD-10-CM

## 2018-11-11 LAB — NOVEL CORONAVIRUS, NAA: SARS-CoV-2, NAA: NOT DETECTED

## 2018-11-16 ENCOUNTER — Other Ambulatory Visit: Payer: Self-pay

## 2018-11-16 DIAGNOSIS — Z20822 Contact with and (suspected) exposure to covid-19: Secondary | ICD-10-CM

## 2018-11-17 LAB — NOVEL CORONAVIRUS, NAA: SARS-CoV-2, NAA: DETECTED — AB

## 2018-11-18 ENCOUNTER — Encounter: Payer: Self-pay | Admitting: Family Medicine

## 2018-11-18 ENCOUNTER — Other Ambulatory Visit: Payer: Self-pay

## 2018-11-18 ENCOUNTER — Ambulatory Visit (INDEPENDENT_AMBULATORY_CARE_PROVIDER_SITE_OTHER): Payer: Medicaid Other | Admitting: Family Medicine

## 2018-11-18 VITALS — Temp 98.2°F | Ht 62.0 in

## 2018-11-18 DIAGNOSIS — U071 COVID-19: Secondary | ICD-10-CM | POA: Diagnosis not present

## 2018-11-18 DIAGNOSIS — Z3A01 Less than 8 weeks gestation of pregnancy: Secondary | ICD-10-CM | POA: Diagnosis not present

## 2018-11-18 DIAGNOSIS — J988 Other specified respiratory disorders: Secondary | ICD-10-CM

## 2018-11-18 DIAGNOSIS — I10 Essential (primary) hypertension: Secondary | ICD-10-CM

## 2018-11-18 DIAGNOSIS — F331 Major depressive disorder, recurrent, moderate: Secondary | ICD-10-CM

## 2018-11-18 MED ORDER — LABETALOL HCL 100 MG PO TABS: 100.0000 mg | ORAL_TABLET | Freq: Two times a day (BID) | ORAL | 1 refills | Status: DC

## 2018-11-18 NOTE — Progress Notes (Signed)
Temp 98.2 F (36.8 C) (Temporal)   Ht 5\' 2"  (1.575 m)   LMP 09/29/2018 (Exact Date)   BMI 30.73 kg/m    Subjective:    Patient ID: Sandy Townsend, female    DOB: 1985-09-09, 33 y.o.   MRN: [id number]  HPI: Sandy Townsend is a 33 y.o. female  Chief Complaint  Patient presents with  . Hypertension    pt states she is pregnant and had concerns about what medications she is supposed to be taking or not  . Depression   PREGNANCY DIAGNOSIS- missed her period 2 weeks ago, LMP July 1st Gravida/Para: [place] pregnancy test: Positive  Nausea: yes Vomiting: yes Breast tenderness: yes Abdominal pain: no Vaginal bleeding: no Pregnancy or labor complications: no Fetal abnormalities: no   Stopped the wellbutrin when she had the + pregnancy- has not been taking it for about 2 weeks, stopped cold [place], but has been feeling a bit more down recently and is anxious about being off everything during her pregnancy.  +COVID- was more tired and SOB, got tested. Has been doing OK- just feeling tired.  HYPERTENSION Hypertension status: controlled  Satisfied with current treatment? yes Duration of hypertension: chronic BP monitoring frequency:  not checking BP medication side effects:  no Medication compliance: excellent compliance Previous BP meds: amlodipine Aspirin: no Recurrent headaches: no Visual changes: no Palpitations: no Dyspnea: yes Chest pain: no Lower extremity edema: no Dizzy/lightheaded: no  Relevant past medical, surgical, family and social history reviewed and updated as indicated. Interim medical history since our last visit reviewed. Allergies and medications reviewed and updated.  Review of Systems  Constitutional: Positive for fatigue. Negative for activity change, appetite change, chills, diaphoresis, fever and unexpected weight change.  HENT: Negative.   Respiratory: Positive for shortness of breath. Negative for apnea, cough, choking, chest tightness, wheezing  and stridor.   Cardiovascular: Negative.   Genitourinary: Negative.   Musculoskeletal: Negative.   Skin: Negative.   Psychiatric/Behavioral: Positive for dysphoric mood. Negative for agitation, behavioral problems, confusion, decreased concentration, hallucinations, self-injury, sleep disturbance and suicidal ideas. The patient is not nervous/anxious and is not hyperactive.     Per HPI unless specifically indicated above     Objective:    Temp 98.2 F (36.8 C) (Temporal)   Ht 5\' 2"  (1.575 m)   LMP 09/29/2018 (Exact Date)   BMI 30.73 kg/m   Wt Readings from Last 3 Encounters:  07/07/18 168 lb (76.2 kg)  04/09/18 172 lb (78 kg)  03/01/18 170 lb (77.1 kg)    Physical Exam Vitals signs and nursing note reviewed.  Constitutional:      General: She is not in acute distress.    Appearance: Normal appearance. She is not ill-appearing, toxic-appearing or diaphoretic.  HENT:     Head: Normocephalic and atraumatic.     Right Ear: External ear normal.     Left Ear: External ear normal.     Nose: Nose normal.     Mouth/Throat:     Mouth: Mucous membranes are moist.     Pharynx: Oropharynx is clear.  Eyes:     General: No scleral icterus.       Right eye: No discharge.        Left eye: No discharge.     Conjunctiva/sclera: Conjunctivae normal.     Pupils: Pupils are equal, round, and reactive to light.  Neck:     Musculoskeletal: Normal range of motion.  Pulmonary:     Effort: Pulmonary effort  is normal. No respiratory distress.     Comments: Speaking in full sentences Musculoskeletal: Normal range of motion.  Skin:    Coloration: Skin is not jaundiced or pale.     Findings: No bruising, erythema, lesion or rash.  Neurological:     Mental Status: She is alert and oriented to person, place, and time. Mental status is at baseline.  Psychiatric:        Mood and Affect: Mood normal.        Behavior: Behavior normal.        Thought Content: Thought content normal.         Judgment: Judgment normal.     Results for orders placed or performed in visit on 11/16/18  Novel Coronavirus, NAA ([facility])   Specimen: Oropharyngeal(OP) collection in vial transport medium   OROPHARYNGEA  TESTING  Result Value Ref Range   SARS-CoV-2, NAA Detected (A) Not Detected      Assessment & Plan:   Problem List Items Addressed This Visit      Cardiovascular and Mediastinum   Essential hypertension    Pregnant. Will change her amlodipine to labetalol and get her in her in the next 2 weeks when she's off COVID quarantine for check on her BP and labs. Call with any concerns.       Relevant Medications   labetalol (NORMODYNE) 100 MG tablet     Other   Major depression, recurrent (HCC)    Has stopped her wellbutrin. Unsure if she's going to need something to help with her mood. Will wait until she's off quarantine for COVID and get her in for evaluation with PCP to consider zoloft.        Other Visit Diagnoses    Less than [redacted] weeks gestation of pregnancy    -  Primary   Currently on quarantine for COVID- will get her in after her 2 weeks quarantine for pregnancy confirmation. Referral to GYN made today. Call with any concerns.    Relevant Orders   Ambulatory referral to Obstetrics / Gynecology   COVID-19       Doing well. On home quarantine. Call with any concerns.        Follow up plan: Return in about 2 weeks (around 12/02/2018) for follow up in person .    Marland Kitchen. This visit was completed via Doximity due to the restrictions of the COVID-19 pandemic. All issues as above were discussed and addressed. Physical exam was done as above through visual confirmation on Doximity. If it was felt that the patient should be evaluated in the office, they were directed there. The patient verbally consented to this visit. . Location of the patient: home . Location of the provider: work . Those involved with this call:  . Provider: Olevia PerchesMegan Long Brimage, DO . CMA: Tiffany Reel, CMA . Front  Desk/Registration: Adela Portshristan Williamson  . Time spent on call: 25 minutes with patient face to face via video conference. More than 50% of this time was spent in counseling and coordination of care. 40 minutes total spent in review of patient's record and preparation of their chart.

## 2018-11-21 ENCOUNTER — Encounter: Payer: Self-pay | Admitting: Family Medicine

## 2018-11-21 NOTE — Assessment & Plan Note (Signed)
Pregnant. Will change her amlodipine to labetalol and get her in her in the next 2 weeks when she's off COVID quarantine for check on her BP and labs. Call with any concerns.

## 2018-11-21 NOTE — Assessment & Plan Note (Signed)
Has stopped her wellbutrin. Unsure if she's going to need something to help with her mood. Will wait until she's off quarantine for COVID and get her in for evaluation with PCP to consider zoloft.

## 2018-11-24 DIAGNOSIS — H1013 Acute atopic conjunctivitis, bilateral: Secondary | ICD-10-CM | POA: Diagnosis not present

## 2018-12-01 ENCOUNTER — Other Ambulatory Visit: Payer: Self-pay

## 2018-12-01 DIAGNOSIS — Z20822 Contact with and (suspected) exposure to covid-19: Secondary | ICD-10-CM

## 2018-12-02 LAB — NOVEL CORONAVIRUS, NAA: SARS-CoV-2, NAA: NOT DETECTED

## 2018-12-09 ENCOUNTER — Other Ambulatory Visit: Payer: Self-pay

## 2018-12-09 ENCOUNTER — Ambulatory Visit (INDEPENDENT_AMBULATORY_CARE_PROVIDER_SITE_OTHER): Payer: Medicaid Other | Admitting: Family Medicine

## 2018-12-09 ENCOUNTER — Encounter: Payer: Self-pay | Admitting: Family Medicine

## 2018-12-09 VITALS — BP 133/84 | HR 85 | Temp 99.6°F | Ht 61.0 in | Wt 159.0 lb

## 2018-12-09 DIAGNOSIS — I10 Essential (primary) hypertension: Secondary | ICD-10-CM | POA: Diagnosis not present

## 2018-12-09 DIAGNOSIS — Z3A10 10 weeks gestation of pregnancy: Secondary | ICD-10-CM | POA: Diagnosis not present

## 2018-12-09 DIAGNOSIS — F331 Major depressive disorder, recurrent, moderate: Secondary | ICD-10-CM | POA: Diagnosis not present

## 2018-12-09 MED ORDER — SERTRALINE HCL 50 MG PO TABS: 50.0000 mg | ORAL_TABLET | Freq: Every day | ORAL | 0 refills | Status: DC

## 2018-12-09 NOTE — Progress Notes (Signed)
BP 133/84   Pulse 85   Temp 99.6 F (37.6 C) (Oral)   Ht 5\' 1"  (1.549 m)   Wt 159 lb (72.1 kg)   LMP 09/29/2018 (Exact Date)   SpO2 99%   BMI 30.04 kg/m    Subjective:    Patient ID: Sandy Townsend, female    DOB: February 03, 1986, 33 y.o.   MRN: [id number]  HPI: Sandy Townsend is a 33 y.o. female  Chief Complaint  Patient presents with  . Hypertension    pregnancy f/u  . Depression   Here today for HTN and new pregnancy f/u. Bothered by food smells and nauseated by certain things but overall feeling well and keeping foods down.   Home BPs since switching to the labetalol have been 130s/80s. Taking once a day instead of twice a day because she never remembers. Denies side effects, CP,SOB, HAs, dizziness. Trying to eat healthy and stay active.   Has been off the wellbutrin since her positive pregnancy test and has struggled with that, especially with all of the huge stressors lately. States she's just been using mind over matter to stay calm, clearing her own thoughts and taking slow sips of water. Keeping appts with the counselor but feeling like that may not be the right fit for her. Feeling very irritable, snapping at her kids, but denies SI/HI.   Depression screen [facility] 2/9 12/09/2018 08/02/2018 04/09/2018  Decreased Interest 0 0 1  Down, Depressed, Hopeless 1 1 1   PHQ - 2 Score 1 1 2   Altered sleeping 1 1 1   Tired, decreased energy 1 1 1   Change in appetite 1 0 0  Feeling bad or failure about yourself  0 0 1  Trouble concentrating 0 0 0  Moving slowly or fidgety/restless 0 0 1  Suicidal thoughts 0 0 0  PHQ-9 Score 4 3 6   Difficult doing work/chores - Not difficult at all -   GAD 7 : Generalized Anxiety Score 08/02/2018 01/27/2018 12/30/2017  Nervous, Anxious, on Edge 0 0 1  Control/stop worrying 0 0 1  Worry too much - different things 0 1 0  Trouble relaxing 1 1 2   Restless 3 1 2   Easily annoyed or irritable 2 0 3  Afraid - awful might happen 1 0 1  Total GAD 7 Score 7 3 10    Anxiety Difficulty Not difficult at all (No Data) Somewhat difficult     Relevant past medical, surgical, family and social history reviewed and updated as indicated. Interim medical history since our last visit reviewed. Allergies and medications reviewed and updated.  Review of Systems  Per HPI unless specifically indicated above     Objective:    BP 133/84   Pulse 85   Temp 99.6 F (37.6 C) (Oral)   Ht 5\' 1"  (1.549 m)   Wt 159 lb (72.1 kg)   LMP 09/29/2018 (Exact Date)   SpO2 99%   BMI 30.04 kg/m   Wt Readings from Last 3 Encounters:  12/09/18 159 lb (72.1 kg)  07/07/18 168 lb (76.2 kg)  04/09/18 172 lb (78 kg)    Physical Exam Vitals signs and nursing note reviewed.  Constitutional:      Appearance: Normal appearance. She is not ill-appearing.  HENT:     Head: Atraumatic.  Eyes:     Extraocular Movements: Extraocular movements intact.     Conjunctiva/sclera: Conjunctivae normal.  Neck:     Musculoskeletal: Normal range of motion and neck supple.  Cardiovascular:  Rate and Rhythm: Normal rate and regular rhythm.     Heart sounds: Normal heart sounds.  Pulmonary:     Effort: Pulmonary effort is normal.     Breath sounds: Normal breath sounds.  Musculoskeletal: Normal range of motion.  Skin:    General: Skin is warm and dry.  Neurological:     Mental Status: She is alert and oriented to person, place, and time.  Psychiatric:        Mood and Affect: Mood normal.        Thought Content: Thought content normal.        Judgment: Judgment normal.     Results for orders placed or performed in visit on 12/01/18  Novel Coronavirus, NAA ([facility])   Specimen: Oropharyngeal(OP) collection in vial transport medium   OROPHARYNGEA  SCREENIN  Result Value Ref Range   SARS-CoV-2, NAA Not Detected Not Detected      Assessment & Plan:   Problem List Items Addressed This Visit      Cardiovascular and Mediastinum   Essential hypertension - Primary    BPs  stable and WNL, continue current regimen        Other   Major depression, recurrent (HCC)    Discussed adding zoloft as a pregnancy-safe option to help manage her moods and anxiety during these very stressful times. Literature given, script sent in case she decides she would like to take it. Can either f/u here in 1 month after starting or with OB once established.       Relevant Medications   sertraline (ZOLOFT) 50 MG tablet    Other Visit Diagnoses    [redacted] weeks gestation of pregnancy       Referral placed to her OBGYN of choice. Prenatal vitamins, nutrition, and other counseling given   Relevant Orders   Ambulatory referral to Gynecology       Follow up plan: Return in about 4 weeks (around 01/06/2019) for mood f/u.

## 2018-12-09 NOTE — Patient Instructions (Signed)
Sertraline tablets What is this medicine? SERTRALINE (SER tra leen) is used to treat depression. It may also be used to treat obsessive compulsive disorder, panic disorder, post-trauma stress, premenstrual dysphoric disorder (PMDD) or social anxiety. This medicine may be used for other purposes; ask your health care provider or pharmacist if you have questions. COMMON BRAND NAME(S): Zoloft What should I tell my health care provider before I take this medicine? They need to know if you have any of these conditions:  bleeding disorders  bipolar disorder or a family history of bipolar disorder  glaucoma  heart disease  high blood pressure  history of irregular heartbeat  history of low levels of calcium, magnesium, or potassium in the blood  if you often drink alcohol  liver disease  receiving electroconvulsive therapy  seizures  suicidal thoughts, plans, or attempt; a previous suicide attempt by you or a family member  take medicines that treat or prevent blood clots  thyroid disease  an unusual or allergic reaction to sertraline, other medicines, foods, dyes, or preservatives  pregnant or trying to get pregnant  breast-feeding How should I use this medicine? Take this medicine by mouth with a glass of water. Follow the directions on the prescription label. You can take it with or without food. Take your medicine at regular intervals. Do not take your medicine more often than directed. Do not stop taking this medicine suddenly except upon the advice of your doctor. Stopping this medicine too quickly may cause serious side effects or your condition may worsen. A special MedGuide will be given to you by the pharmacist with each prescription and refill. Be sure to read this information carefully each time. Talk to your pediatrician regarding the use of this medicine in children. While this drug may be prescribed for children as young as 7 years for selected conditions,  precautions do apply. Overdosage: If you think you have taken too much of this medicine contact a poison control center or emergency room at once. NOTE: This medicine is only for you. Do not share this medicine with others. What if I miss a dose? If you miss a dose, take it as soon as you can. If it is almost time for your next dose, take only that dose. Do not take double or extra doses. What may interact with this medicine? Do not take this medicine with any of the following medications:  cisapride  dronedarone  linezolid  MAOIs like Carbex, Eldepryl, Marplan, Nardil, and Parnate  methylene blue (injected into a vein)  pimozide  thioridazine This medicine may also interact with the following medications:  alcohol  amphetamines  aspirin and aspirin-like medicines  certain medicines for depression, anxiety, or psychotic disturbances  certain medicines for fungal infections like ketoconazole, fluconazole, posaconazole, and itraconazole  certain medicines for irregular heart beat like flecainide, quinidine, propafenone  certain medicines for migraine headaches like almotriptan, eletriptan, frovatriptan, naratriptan, rizatriptan, sumatriptan, zolmitriptan  certain medicines for sleep  certain medicines for seizures like carbamazepine, valproic acid, phenytoin  certain medicines that treat or prevent blood clots like warfarin, enoxaparin, dalteparin  cimetidine  digoxin  diuretics  fentanyl  isoniazid  lithium  NSAIDs, medicines for pain and inflammation, like ibuprofen or naproxen  other medicines that prolong the QT interval (cause an abnormal heart rhythm) like dofetilide  rasagiline  safinamide  supplements like St. John's wort, kava kava, valerian  tolbutamide  tramadol  tryptophan This list may not describe all possible interactions. Give your health care provider   a list of all the medicines, herbs, non-prescription drugs, or dietary supplements  you use. Also tell them if you smoke, drink alcohol, or use illegal drugs. Some items may interact with your medicine. What should I watch for while using this medicine? Tell your doctor if your symptoms do not get better or if they get worse. Visit your doctor or health care professional for regular checks on your progress. Because it may take several weeks to see the full effects of this medicine, it is important to continue your treatment as prescribed by your doctor. Patients and their families should watch out for new or worsening thoughts of suicide or depression. Also watch out for sudden changes in feelings such as feeling anxious, agitated, panicky, irritable, hostile, aggressive, impulsive, severely restless, overly excited and hyperactive, or not being able to sleep. If this happens, especially at the beginning of treatment or after a change in dose, call your health care professional. You may get drowsy or dizzy. Do not drive, use machinery, or do anything that needs mental alertness until you know how this medicine affects you. Do not stand or sit up quickly, especially if you are an older patient. This reduces the risk of dizzy or fainting spells. Alcohol may interfere with the effect of this medicine. Avoid alcoholic drinks. Your mouth may get dry. Chewing sugarless gum or sucking hard candy, and drinking plenty of water may help. Contact your doctor if the problem does not go away or is severe. What side effects may I notice from receiving this medicine? Side effects that you should report to your doctor or health care professional as soon as possible:  allergic reactions like skin rash, itching or hives, swelling of the face, lips, or tongue  anxious  black, tarry stools  changes in vision  confusion  elevated mood, decreased need for sleep, racing thoughts, impulsive behavior  eye pain  fast, irregular heartbeat  feeling faint or lightheaded, falls  feeling agitated,  angry, or irritable  hallucination, loss of contact with reality  loss of balance or coordination  loss of memory  painful or prolonged erections  restlessness, pacing, inability to keep still  seizures  stiff muscles  suicidal thoughts or other mood changes  trouble sleeping  unusual bleeding or bruising  unusually weak or tired  vomiting Side effects that usually do not require medical attention (report to your doctor or health care professional if they continue or are bothersome):  change in appetite or weight  change in sex drive or performance  diarrhea  increased sweating  indigestion, nausea  tremors This list may not describe all possible side effects. Call your doctor for medical advice about side effects. You may report side effects to FDA at 1-800-FDA-1088. Where should I keep my medicine? Keep out of the reach of children. Store at room temperature between 15 and 30 degrees C (59 and 86 degrees F). Throw away any unused medicine after the expiration date. NOTE: This sheet is a summary. It may not cover all possible information. If you have questions about this medicine, talk to your doctor, pharmacist, or health care provider.  2020 Elsevier/Gold Standard (2018-03-09 10:09:27)  

## 2018-12-13 NOTE — Assessment & Plan Note (Signed)
BPs stable and WNL, continue current regimen 

## 2018-12-13 NOTE — Assessment & Plan Note (Signed)
Discussed adding zoloft as a pregnancy-safe option to help manage her moods and anxiety during these very stressful times. Literature given, script sent in case she decides she would like to take it. Can either f/u here in 1 month after starting or with OB once established.

## 2018-12-16 DIAGNOSIS — Z3481 Encounter for supervision of other normal pregnancy, first trimester: Secondary | ICD-10-CM | POA: Diagnosis not present

## 2018-12-16 DIAGNOSIS — O3680X9 Pregnancy with inconclusive fetal viability, other fetus: Secondary | ICD-10-CM | POA: Diagnosis not present

## 2018-12-16 DIAGNOSIS — I10 Essential (primary) hypertension: Secondary | ICD-10-CM | POA: Diagnosis not present

## 2018-12-16 DIAGNOSIS — F329 Major depressive disorder, single episode, unspecified: Secondary | ICD-10-CM | POA: Diagnosis not present

## 2018-12-16 DIAGNOSIS — N925 Other specified irregular menstruation: Secondary | ICD-10-CM | POA: Diagnosis not present

## 2018-12-16 DIAGNOSIS — Z3A11 11 weeks gestation of pregnancy: Secondary | ICD-10-CM | POA: Diagnosis not present

## 2018-12-16 LAB — OB RESULTS CONSOLE GC/CHLAMYDIA
Chlamydia: NEGATIVE
Gonorrhea: NEGATIVE

## 2018-12-16 LAB — OB RESULTS CONSOLE ANTIBODY SCREEN: Antibody Screen: NEGATIVE

## 2018-12-16 LAB — OB RESULTS CONSOLE HEPATITIS B SURFACE ANTIGEN: Hepatitis B Surface Ag: NEGATIVE

## 2018-12-16 LAB — OB RESULTS CONSOLE ABO/RH: RH Type: POSITIVE

## 2018-12-16 LAB — OB RESULTS CONSOLE RPR: RPR: NONREACTIVE

## 2018-12-16 LAB — OB RESULTS CONSOLE RUBELLA ANTIBODY, IGM: Rubella: IMMUNE

## 2018-12-16 LAB — OB RESULTS CONSOLE HIV ANTIBODY (ROUTINE TESTING): HIV: NONREACTIVE

## 2018-12-21 DIAGNOSIS — F332 Major depressive disorder, recurrent severe without psychotic features: Secondary | ICD-10-CM | POA: Diagnosis not present

## 2018-12-22 ENCOUNTER — Telehealth: Payer: Self-pay | Admitting: Obstetrics and Gynecology

## 2018-12-22 NOTE — Telephone Encounter (Signed)
Attempted to call patient about her appointment on 9/24 @ 10:30. No answer, voicemail was left instructing patient that the visit is a telephone visit and she does not have to come to the office. Patient instructed to be available around her appointment time. Patient instructed to give the office a call back if she is needing to reschedule.

## 2018-12-23 ENCOUNTER — Other Ambulatory Visit: Payer: Self-pay

## 2018-12-23 ENCOUNTER — Ambulatory Visit: Payer: Medicaid Other | Admitting: *Deleted

## 2018-12-23 DIAGNOSIS — O0990 Supervision of high risk pregnancy, unspecified, unspecified trimester: Secondary | ICD-10-CM | POA: Insufficient documentation

## 2018-12-23 NOTE — Progress Notes (Signed)
10:24 I called Sandy Townsend and she apoligized but states she wants to cancel this appointment because she is going to go to another provider for her pregnancy.   Jacques Navy

## 2018-12-30 ENCOUNTER — Encounter: Payer: Medicaid Other | Admitting: Obstetrics and Gynecology

## 2019-01-10 DIAGNOSIS — Z20828 Contact with and (suspected) exposure to other viral communicable diseases: Secondary | ICD-10-CM | POA: Diagnosis not present

## 2019-01-18 DIAGNOSIS — Z3A16 16 weeks gestation of pregnancy: Secondary | ICD-10-CM | POA: Diagnosis not present

## 2019-01-18 DIAGNOSIS — Z3482 Encounter for supervision of other normal pregnancy, second trimester: Secondary | ICD-10-CM | POA: Diagnosis not present

## 2019-01-18 DIAGNOSIS — Z363 Encounter for antenatal screening for malformations: Secondary | ICD-10-CM | POA: Diagnosis not present

## 2019-01-22 ENCOUNTER — Inpatient Hospital Stay (HOSPITAL_COMMUNITY)
Admission: AD | Admit: 2019-01-22 | Discharge: 2019-01-22 | Disposition: A | Discharge status: Home / Self Care | Payer: Medicaid Other | Attending: Obstetrics and Gynecology | Admitting: Obstetrics and Gynecology

## 2019-01-22 ENCOUNTER — Other Ambulatory Visit: Payer: Self-pay

## 2019-01-22 ENCOUNTER — Encounter (HOSPITAL_COMMUNITY): Payer: Self-pay | Admitting: *Deleted

## 2019-01-22 DIAGNOSIS — F419 Anxiety disorder, unspecified: Secondary | ICD-10-CM | POA: Diagnosis not present

## 2019-01-22 DIAGNOSIS — Z79899 Other long term (current) drug therapy: Secondary | ICD-10-CM | POA: Insufficient documentation

## 2019-01-22 DIAGNOSIS — R1013 Epigastric pain: Secondary | ICD-10-CM

## 2019-01-22 DIAGNOSIS — F329 Major depressive disorder, single episode, unspecified: Secondary | ICD-10-CM | POA: Diagnosis not present

## 2019-01-22 DIAGNOSIS — W109XXA Fall (on) (from) unspecified stairs and steps, initial encounter: Secondary | ICD-10-CM | POA: Insufficient documentation

## 2019-01-22 DIAGNOSIS — R109 Unspecified abdominal pain: Secondary | ICD-10-CM

## 2019-01-22 DIAGNOSIS — O26892 Other specified pregnancy related conditions, second trimester: Secondary | ICD-10-CM

## 2019-01-22 DIAGNOSIS — O26899 Other specified pregnancy related conditions, unspecified trimester: Secondary | ICD-10-CM

## 2019-01-22 DIAGNOSIS — O99342 Other mental disorders complicating pregnancy, second trimester: Secondary | ICD-10-CM | POA: Diagnosis not present

## 2019-01-22 DIAGNOSIS — Z3A16 16 weeks gestation of pregnancy: Secondary | ICD-10-CM | POA: Insufficient documentation

## 2019-01-22 HISTORY — DX: Anxiety disorder, unspecified: F41.9

## 2019-01-22 HISTORY — DX: Depression, unspecified: F32.A

## 2019-01-22 LAB — URINALYSIS, ROUTINE W REFLEX MICROSCOPIC
Bilirubin Urine: NEGATIVE
Glucose, UA: NEGATIVE mg/dL
Hgb urine dipstick: NEGATIVE
Ketones, ur: NEGATIVE mg/dL
Nitrite: NEGATIVE
Protein, ur: NEGATIVE mg/dL
Specific Gravity, Urine: 1.005 (ref 1.005–1.030)
pH: 5 (ref 5.0–8.0)

## 2019-01-22 LAB — CBC WITH DIFFERENTIAL/PLATELET
Abs Immature Granulocytes: 0.05 10*3/uL (ref 0.00–0.07)
Basophils Absolute: 0 10*3/uL (ref 0.0–0.1)
Basophils Relative: 0 %
Eosinophils Absolute: 0.1 10*3/uL (ref 0.0–0.5)
Eosinophils Relative: 1 %
HCT: 33.4 % — ABNORMAL LOW (ref 36.0–46.0)
Hemoglobin: 11.7 g/dL — ABNORMAL LOW (ref 12.0–15.0)
Immature Granulocytes: 1 %
Lymphocytes Relative: 18 %
Lymphs Abs: 1.8 10*3/uL (ref 0.7–4.0)
MCH: 31.7 pg (ref 26.0–34.0)
MCHC: 35 g/dL (ref 30.0–36.0)
MCV: 90.5 fL (ref 80.0–100.0)
Monocytes Absolute: 0.6 10*3/uL (ref 0.1–1.0)
Monocytes Relative: 6 %
Neutro Abs: 7.3 10*3/uL (ref 1.7–7.7)
Neutrophils Relative %: 74 %
Platelets: 253 10*3/uL (ref 150–400)
RBC: 3.69 MIL/uL — ABNORMAL LOW (ref 3.87–5.11)
RDW: 12.3 % (ref 11.5–15.5)
WBC: 9.8 10*3/uL (ref 4.0–10.5)
nRBC: 0 % (ref 0.0–0.2)

## 2019-01-22 MED ORDER — PANTOPRAZOLE SODIUM 20 MG PO TBEC: 20.0000 mg | DELAYED_RELEASE_TABLET | Freq: Two times a day (BID) | ORAL | 1 refills | Status: DC

## 2019-01-22 MED ORDER — ALUM & MAG HYDROXIDE-SIMETH 200-200-20 MG/5ML PO SUSP
30.0000 mL | Freq: Once | ORAL | Status: AC
  Administered 2019-01-22: 30 mL via ORAL
  Filled 2019-01-22: qty 30

## 2019-01-22 NOTE — MAU Note (Signed)
Pt reports to MAU c/o severe cramps that come and go since yesterday. Pt states they have progressively getting worse. Pt states she is now uncomfortable and wanted to get checked. Pt states 2 days ago she fell going up the stairs and she hit the top of her stomach. Pt denies vaginal bleeding or discharge. Pt has not yet felt movement this pregnancy so she is unsure of FM at this time.
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
## 2019-01-22 NOTE — Discharge Instructions (Signed)
Peptic Ulcer  A peptic ulcer is a sore in the lining of the stomach (gastric ulcer) or the first part of the small intestine (duodenal ulcer). The ulcer causes a gradual wearing away (erosion) of the deeper tissue. What are the causes? Normally, the lining of the stomach and the small intestine protects them from the acid that digests food. The protective lining can be damaged by:  An infection caused by a type of bacteria called Helicobacter pylori or H. pylori.  Regular use of NSAIDs, such as ibuprofen or aspirin.  Rare tumors in the stomach, small intestine, or pancreas (Zollinger-Ellison syndrome). What increases the risk? The following factors may make you more likely to develop this condition:  Smoking.  Having a family history of ulcer disease.  Drinking alcohol.  Having been hospitalized in an intensive care unit (ICU). What are the signs or symptoms? Symptoms of this condition include:  Persistent burning pain in the area between the chest and the belly button. The pain may be worse on an empty stomach and at night.  Heartburn.  Nausea and vomiting.  Bloating. If the ulcer results in bleeding, it can cause:  Black, tarry stools.  Vomiting of bright red blood.  Vomiting of material that looks like coffee grounds. How is this diagnosed? This condition may be diagnosed based on:  Your medical history and a physical exam.  Various tests or procedures, such as: ? Blood tests, stool tests, or breath tests to check for the H. pylori bacteria. ? An X-ray exam (upper gastrointestinal series) of the esophagus, stomach, and small intestine. ? Upper endoscopy. The health care provider examines the esophagus, stomach, and small intestine using a small flexible tube that has a video camera at the end. ? Biopsy. A tissue sample is removed to be examined under a microscope. How is this treated? Treatment for this condition may include:  Eliminating the cause of the ulcer,  such as smoking or use of NSAIDs, and limiting alcohol and caffeine intake.  Medicines to reduce the amount of acid in your digestive tract.  Antibiotic medicines, if the ulcer is caused by an H. pylori infection.  An upper endoscopy may be used to treat a bleeding ulcer.  Surgery. This may be needed if the bleeding is severe or if the ulcer created a hole somewhere in the digestive system. Follow these instructions at home:  Do not drink alcohol if your health care provider tells you not to drink.  Do not use any products that contain nicotine or tobacco, such as cigarettes, e-cigarettes, and chewing tobacco. If you need help quitting, ask your health care provider.  Take over-the-counter and prescription medicines only as told by your health care provider. ? Do not use over-the-counter medicines in place of prescription medicines unless your health care provider approves. ? Do not take aspirin, ibuprofen, or other NSAIDs unless your health care provider told you to do so.  Take over-the-counter and prescription medicines only as told by your health care provider.  Keep all follow-up visits as told by your health care provider. This is important. Contact a health care provider if:  Your symptoms do not improve within 7 days of starting treatment.  You have ongoing indigestion or heartburn. Get help right away if:  You have sudden, sharp, or persistent pain in your abdomen.  You have bloody or dark black, tarry stools.  You vomit blood or material that looks like coffee grounds.  You become light-headed or you feel faint.    You become weak.  You become sweaty or clammy. Summary  A peptic ulcer is a sore in the lining of the stomach (gastric ulcer) or the first part of the small intestine (duodenal ulcer). The ulcer causes a gradual wearing away (erosion) of the deeper tissue.  Do not use any products that contain nicotine or tobacco, such as cigarettes, e-cigarettes, and  chewing tobacco. If you need help quitting, ask your health care provider.  Take over-the-counter and prescription medicines only as told by your health care provider. Do not use over-the-counter medicines in place of prescription medicines unless your health care provider approves.  Contact your health care provider if you have ongoing indigestion or heartburn.  Keep all follow-up visits as told by your health care provider. This is important. This information is not intended to replace advice given to you by your health care provider. Make sure you discuss any questions you have with your health care provider. Document Released: 03/14/2000 Document Revised: 09/22/2017 Document Reviewed: 09/22/2017 Elsevier Patient Education  2020 Elsevier Inc.  

## 2019-01-23 LAB — COMPREHENSIVE METABOLIC PANEL
ALT: 13 U/L (ref 0–44)
AST: 15 U/L (ref 15–41)
Albumin: 3.2 g/dL — ABNORMAL LOW (ref 3.5–5.0)
Alkaline Phosphatase: 35 U/L — ABNORMAL LOW (ref 38–126)
Anion gap: 9 (ref 5–15)
BUN: 5 mg/dL — ABNORMAL LOW (ref 6–20)
CO2: 23 mmol/L (ref 22–32)
Calcium: 9 mg/dL (ref 8.9–10.3)
Chloride: 106 mmol/L (ref 98–111)
Creatinine, Ser: 0.61 mg/dL (ref 0.44–1.00)
GFR calc Af Amer: >60 mL/min (ref >60)
GFR calc non Af Amer: >60 mL/min (ref >60)
Glucose, Bld: 82 mg/dL (ref 70–99)
Potassium: 3.6 mmol/L (ref 3.5–5.1)
Sodium: 138 mmol/L (ref 135–145)
Total Bilirubin: 0.2 mg/dL — ABNORMAL LOW (ref 0.3–1.2)
Total Protein: 6.4 g/dL — ABNORMAL LOW (ref 6.5–8.1)

## 2019-01-24 LAB — H. PYLORI ANTIBODY, IGG: H Pylori IgG: 0.2 Index Value (ref 0.00–0.79)

## 2019-01-31 DIAGNOSIS — F332 Major depressive disorder, recurrent severe without psychotic features: Secondary | ICD-10-CM | POA: Diagnosis not present

## 2019-02-09 ENCOUNTER — Encounter: Payer: Medicaid Other | Admitting: Family Medicine

## 2019-02-17 ENCOUNTER — Encounter: Payer: Medicaid Other | Admitting: Family Medicine

## 2019-02-18 DIAGNOSIS — Z363 Encounter for antenatal screening for malformations: Secondary | ICD-10-CM | POA: Diagnosis not present

## 2019-02-18 DIAGNOSIS — Z3A20 20 weeks gestation of pregnancy: Secondary | ICD-10-CM | POA: Diagnosis not present

## 2019-04-01 NOTE — L&D Delivery Note (Signed)
Delivery Note At  a viable female was delivered via  (Presentation:ROA      ).  APGAR:8 ,9 ; weight pending  .   Placenta status:complete  ,  . 3V Cord:   with the following complications: none  .    Anesthesia:Epidural   Episiotomy:None   Lacerations:None   Suture Repair:N/A Est. Blood Loss (mL):75cc    Mom to postpartum.  Baby to Couplet care / Skin to Skin.  Henderson Newcomer Blakely Gluth 06/26/2019, 1:14 AM

## 2019-04-26 DIAGNOSIS — O99810 Abnormal glucose complicating pregnancy: Secondary | ICD-10-CM | POA: Diagnosis not present

## 2019-05-11 DIAGNOSIS — I10 Essential (primary) hypertension: Secondary | ICD-10-CM | POA: Diagnosis not present

## 2019-05-11 DIAGNOSIS — Z331 Pregnant state, incidental: Secondary | ICD-10-CM | POA: Diagnosis not present

## 2019-05-11 DIAGNOSIS — Z3A32 32 weeks gestation of pregnancy: Secondary | ICD-10-CM | POA: Diagnosis not present

## 2019-05-11 DIAGNOSIS — O99891 Other specified diseases and conditions complicating pregnancy: Secondary | ICD-10-CM | POA: Diagnosis not present

## 2019-05-11 DIAGNOSIS — D573 Sickle-cell trait: Secondary | ICD-10-CM | POA: Diagnosis not present

## 2019-05-13 ENCOUNTER — Other Ambulatory Visit (HOSPITAL_COMMUNITY): Payer: Self-pay | Admitting: Obstetrics and Gynecology

## 2019-05-13 DIAGNOSIS — O10919 Unspecified pre-existing hypertension complicating pregnancy, unspecified trimester: Secondary | ICD-10-CM

## 2019-05-13 DIAGNOSIS — D573 Sickle-cell trait: Secondary | ICD-10-CM

## 2019-05-13 DIAGNOSIS — Z363 Encounter for antenatal screening for malformations: Secondary | ICD-10-CM

## 2019-05-13 DIAGNOSIS — O99019 Anemia complicating pregnancy, unspecified trimester: Secondary | ICD-10-CM

## 2019-05-17 ENCOUNTER — Ambulatory Visit (HOSPITAL_COMMUNITY): Payer: Medicaid Other

## 2019-05-17 ENCOUNTER — Encounter (HOSPITAL_COMMUNITY): Payer: Self-pay

## 2019-05-21 ENCOUNTER — Emergency Department (HOSPITAL_COMMUNITY): Payer: Medicaid Other

## 2019-05-21 ENCOUNTER — Emergency Department (HOSPITAL_COMMUNITY)
Admission: EM | Admit: 2019-05-21 | Discharge: 2019-05-21 | Disposition: A | Discharge status: Home / Self Care | Payer: Medicaid Other | Attending: Emergency Medicine | Admitting: Emergency Medicine

## 2019-05-21 ENCOUNTER — Other Ambulatory Visit: Payer: Self-pay

## 2019-05-21 ENCOUNTER — Encounter (HOSPITAL_COMMUNITY): Payer: Self-pay

## 2019-05-21 DIAGNOSIS — Z3A34 34 weeks gestation of pregnancy: Secondary | ICD-10-CM

## 2019-05-21 DIAGNOSIS — O9A213 Injury, poisoning and certain other consequences of external causes complicating pregnancy, third trimester: Secondary | ICD-10-CM | POA: Insufficient documentation

## 2019-05-21 DIAGNOSIS — M25512 Pain in left shoulder: Secondary | ICD-10-CM | POA: Diagnosis not present

## 2019-05-21 DIAGNOSIS — R0602 Shortness of breath: Secondary | ICD-10-CM | POA: Diagnosis not present

## 2019-05-21 DIAGNOSIS — Y999 Unspecified external cause status: Secondary | ICD-10-CM | POA: Insufficient documentation

## 2019-05-21 DIAGNOSIS — Z79899 Other long term (current) drug therapy: Secondary | ICD-10-CM | POA: Diagnosis not present

## 2019-05-21 DIAGNOSIS — Y92410 Unspecified street and highway as the place of occurrence of the external cause: Secondary | ICD-10-CM | POA: Diagnosis not present

## 2019-05-21 DIAGNOSIS — M542 Cervicalgia: Secondary | ICD-10-CM | POA: Diagnosis not present

## 2019-05-21 DIAGNOSIS — O26813 Pregnancy related exhaustion and fatigue, third trimester: Secondary | ICD-10-CM | POA: Insufficient documentation

## 2019-05-21 DIAGNOSIS — O99891 Other specified diseases and conditions complicating pregnancy: Secondary | ICD-10-CM | POA: Diagnosis not present

## 2019-05-21 DIAGNOSIS — S299XXA Unspecified injury of thorax, initial encounter: Secondary | ICD-10-CM | POA: Diagnosis not present

## 2019-05-21 DIAGNOSIS — I10 Essential (primary) hypertension: Secondary | ICD-10-CM | POA: Diagnosis not present

## 2019-05-21 DIAGNOSIS — Y9389 Activity, other specified: Secondary | ICD-10-CM | POA: Insufficient documentation

## 2019-05-21 DIAGNOSIS — O10013 Pre-existing essential hypertension complicating pregnancy, third trimester: Secondary | ICD-10-CM | POA: Diagnosis not present

## 2019-05-21 LAB — CBC
HCT: 35.1 % — ABNORMAL LOW (ref 36.0–46.0)
Hemoglobin: 12 g/dL (ref 12.0–15.0)
MCH: 31.1 pg (ref 26.0–34.0)
MCHC: 34.2 g/dL (ref 30.0–36.0)
MCV: 90.9 fL (ref 80.0–100.0)
Platelets: 174 10*3/uL (ref 150–400)
RBC: 3.86 MIL/uL — ABNORMAL LOW (ref 3.87–5.11)
RDW: 13 % (ref 11.5–15.5)
WBC: 11.6 10*3/uL — ABNORMAL HIGH (ref 4.0–10.5)
nRBC: 0 % (ref 0.0–0.2)

## 2019-05-21 LAB — COMPREHENSIVE METABOLIC PANEL
ALT: 14 U/L (ref 0–44)
AST: 17 U/L (ref 15–41)
Albumin: 2.8 g/dL — ABNORMAL LOW (ref 3.5–5.0)
Alkaline Phosphatase: 65 U/L (ref 38–126)
Anion gap: 10 (ref 5–15)
BUN: <5 mg/dL — ABNORMAL LOW (ref 6–20)
CO2: 22 mmol/L (ref 22–32)
Calcium: 8.5 mg/dL — ABNORMAL LOW (ref 8.9–10.3)
Chloride: 105 mmol/L (ref 98–111)
Creatinine, Ser: 0.62 mg/dL (ref 0.44–1.00)
GFR calc Af Amer: >60 mL/min (ref >60)
GFR calc non Af Amer: >60 mL/min (ref >60)
Glucose, Bld: 75 mg/dL (ref 70–99)
Potassium: 3.4 mmol/L — ABNORMAL LOW (ref 3.5–5.1)
Sodium: 137 mmol/L (ref 135–145)
Total Bilirubin: 0.7 mg/dL (ref 0.3–1.2)
Total Protein: 6.3 g/dL — ABNORMAL LOW (ref 6.5–8.1)

## 2019-05-21 LAB — PROTEIN / CREATININE RATIO, URINE
Creatinine, Urine: 47.06 mg/dL
Total Protein, Urine: <6 mg/dL

## 2019-05-21 LAB — LIPASE, BLOOD: Lipase: 20 U/L (ref 11–51)

## 2019-05-21 MED ORDER — NIFEDIPINE 10 MG PO CAPS
10.0000 mg | ORAL_CAPSULE | Freq: Once | ORAL | Status: AC
  Administered 2019-05-21: 19:00:00 10 mg via ORAL
  Filled 2019-05-21: qty 1

## 2019-05-21 MED ORDER — NIFEDIPINE 10 MG PO CAPS
10.0000 mg | ORAL_CAPSULE | Freq: Once | ORAL | Status: AC
  Administered 2019-05-21: 10 mg via ORAL

## 2019-05-21 MED ORDER — LACTATED RINGERS IV BOLUS
1000.0000 mL | Freq: Once | INTRAVENOUS | Status: AC
  Administered 2019-05-21: 1000 mL via INTRAVENOUS

## 2019-05-21 NOTE — Progress Notes (Signed)
Report given to Fransisco Beau RN

## 2019-05-21 NOTE — Progress Notes (Signed)
Dr Sallye Ober updated on patient status. EFM reactive and reassuring, 130-145, 15x15, no decels. Ctx irregular after 2 doses of procardia, labs WNL, BP normal range for patient. Patient denies worsening contractions at this time. Denied pain related to contractions at this time. Patient instructed to follow up with OB office early next week. Patient has scheduled appt on Monday. Fetal monitoring up at 9. Patient is cleared for discharge from OB at this time.
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
## 2019-05-21 NOTE — Discharge Instructions (Signed)
Follow-up with your OB/GYN.  Call them for follow-up time.  Return for any new or worse symptoms.  Would expect to be a little stiff and sore probably the next 2 days.  If you are allowed to take Tylenol taking Tylenol for that would be fine.  Definitely get follow-up for any abdominal pain any vaginal bleeding.

## 2019-05-21 NOTE — ED Notes (Signed)
OB RR d/c OB monitoring, d/c'd IV.

## 2019-05-21 NOTE — ED Notes (Signed)
OB Rapid Response RN called @ 1655-per Albin Felling, RN called by Marylene Land
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
## 2019-05-21 NOTE — Progress Notes (Signed)
Spoke with Dr. Sallye Ober. Pt can have 10mg  of po procardia now and RN may repeat the dose in 20 min if pt is still contracting.

## 2019-05-21 NOTE — Progress Notes (Signed)
Spoke with Dr. Sallye Ober. Orders received for protein creatine ratio and a liter of LR. If labs and blood pressures are stable, after four hours of fetal monitoring, pt can be d/c home.

## 2019-05-23 DIAGNOSIS — I10 Essential (primary) hypertension: Secondary | ICD-10-CM | POA: Diagnosis not present

## 2019-05-26 DIAGNOSIS — Z3A34 34 weeks gestation of pregnancy: Secondary | ICD-10-CM | POA: Diagnosis not present

## 2019-05-26 DIAGNOSIS — Z3483 Encounter for supervision of other normal pregnancy, third trimester: Secondary | ICD-10-CM | POA: Diagnosis not present

## 2019-05-26 DIAGNOSIS — I10 Essential (primary) hypertension: Secondary | ICD-10-CM | POA: Diagnosis not present

## 2019-05-30 DIAGNOSIS — Z3483 Encounter for supervision of other normal pregnancy, third trimester: Secondary | ICD-10-CM | POA: Diagnosis not present

## 2019-05-30 DIAGNOSIS — Z3A35 35 weeks gestation of pregnancy: Secondary | ICD-10-CM | POA: Diagnosis not present

## 2019-05-30 DIAGNOSIS — F329 Major depressive disorder, single episode, unspecified: Secondary | ICD-10-CM | POA: Diagnosis not present

## 2019-05-30 DIAGNOSIS — R12 Heartburn: Secondary | ICD-10-CM | POA: Diagnosis not present

## 2019-05-30 DIAGNOSIS — I10 Essential (primary) hypertension: Secondary | ICD-10-CM | POA: Diagnosis not present

## 2019-05-30 LAB — OB RESULTS CONSOLE GBS: GBS: NEGATIVE

## 2019-06-02 DIAGNOSIS — I10 Essential (primary) hypertension: Secondary | ICD-10-CM | POA: Diagnosis not present

## 2019-06-10 DIAGNOSIS — I10 Essential (primary) hypertension: Secondary | ICD-10-CM | POA: Diagnosis not present

## 2019-06-10 DIAGNOSIS — Z3A36 36 weeks gestation of pregnancy: Secondary | ICD-10-CM | POA: Diagnosis not present

## 2019-06-13 DIAGNOSIS — I10 Essential (primary) hypertension: Secondary | ICD-10-CM | POA: Diagnosis not present

## 2019-06-13 DIAGNOSIS — N760 Acute vaginitis: Secondary | ICD-10-CM | POA: Diagnosis not present

## 2019-06-13 DIAGNOSIS — Z3A37 37 weeks gestation of pregnancy: Secondary | ICD-10-CM | POA: Diagnosis not present

## 2019-06-14 ENCOUNTER — Telehealth (HOSPITAL_COMMUNITY): Payer: Self-pay | Admitting: *Deleted

## 2019-06-14 ENCOUNTER — Encounter (HOSPITAL_COMMUNITY): Payer: Self-pay | Admitting: *Deleted

## 2019-06-14 NOTE — Telephone Encounter (Signed)
Preadmission screen  

## 2019-06-15 ENCOUNTER — Encounter (HOSPITAL_COMMUNITY): Payer: Self-pay | Admitting: Obstetrics & Gynecology

## 2019-06-15 ENCOUNTER — Other Ambulatory Visit: Payer: Self-pay

## 2019-06-15 ENCOUNTER — Encounter (HOSPITAL_COMMUNITY): Payer: Self-pay | Admitting: *Deleted

## 2019-06-15 ENCOUNTER — Inpatient Hospital Stay (HOSPITAL_COMMUNITY)
Admission: AD | Admit: 2019-06-15 | Discharge: 2019-06-15 | Disposition: A | Discharge status: Home / Self Care | Payer: Medicaid Other | Attending: Obstetrics & Gynecology | Admitting: Obstetrics & Gynecology

## 2019-06-15 DIAGNOSIS — Z3A37 37 weeks gestation of pregnancy: Secondary | ICD-10-CM | POA: Diagnosis not present

## 2019-06-15 DIAGNOSIS — O471 False labor at or after 37 completed weeks of gestation: Secondary | ICD-10-CM | POA: Insufficient documentation

## 2019-06-15 DIAGNOSIS — O479 False labor, unspecified: Secondary | ICD-10-CM

## 2019-06-15 NOTE — MAU Provider Note (Signed)
S: Ms. Sandy Townsend is a 34 y.o. [phone] at [redacted]w[redacted]d  who presents to MAU today for labor evaluation.     Cervical exam by RN:  Dilation: Closed Effacement (%): Thick Presentation: Undeterminable Exam by:: Quintella Baton RNC  Fetal Monitoring: Baseline: 140 Variability: average Accelerations: present Decelerations: absent Contractions: q 6-12 min  MDM Discussed patient with RN. NST reviewed.   A: SIUP at [redacted]w[redacted]d  False labor  P: Discharge home Labor precautions and kick counts included in AVS Patient to follow-up with office as scheduled  Patient may return to MAU as needed or when in labor   Sandy Townsend, [place] 06/15/2019 11:12 PM

## 2019-06-15 NOTE — Progress Notes (Signed)
Wynelle Bourgeois CNM notified of pt's admission and assessment. FM strip reviewed.

## 2019-06-15 NOTE — Discharge Instructions (Signed)

## 2019-06-15 NOTE — MAU Note (Signed)
Ctxs since 1800. Has been having ctxs for wks but tonight have been painful since 1800. Denies VB or LOF. Cervix closed on Monday

## 2019-06-15 NOTE — Progress Notes (Signed)
Baby very active. Pt states having sharp pains in lower abd that are different than ligament pain

## 2019-06-17 DIAGNOSIS — I10 Essential (primary) hypertension: Secondary | ICD-10-CM | POA: Diagnosis not present

## 2019-06-17 DIAGNOSIS — Z331 Pregnant state, incidental: Secondary | ICD-10-CM | POA: Diagnosis not present

## 2019-06-17 DIAGNOSIS — D573 Sickle-cell trait: Secondary | ICD-10-CM | POA: Diagnosis not present

## 2019-06-17 DIAGNOSIS — Z3493 Encounter for supervision of normal pregnancy, unspecified, third trimester: Secondary | ICD-10-CM | POA: Diagnosis not present

## 2019-06-17 DIAGNOSIS — O10019 Pre-existing essential hypertension complicating pregnancy, unspecified trimester: Secondary | ICD-10-CM | POA: Diagnosis not present

## 2019-06-17 DIAGNOSIS — Z3A37 37 weeks gestation of pregnancy: Secondary | ICD-10-CM | POA: Diagnosis not present

## 2019-06-20 DIAGNOSIS — Z3A38 38 weeks gestation of pregnancy: Secondary | ICD-10-CM | POA: Diagnosis not present

## 2019-06-20 DIAGNOSIS — Z3483 Encounter for supervision of other normal pregnancy, third trimester: Secondary | ICD-10-CM | POA: Diagnosis not present

## 2019-06-20 DIAGNOSIS — I10 Essential (primary) hypertension: Secondary | ICD-10-CM | POA: Diagnosis not present

## 2019-06-23 ENCOUNTER — Other Ambulatory Visit (HOSPITAL_COMMUNITY)
Admission: RE | Admit: 2019-06-23 | Discharge: 2019-06-23 | Disposition: A | Discharge status: Home / Self Care | Payer: Medicaid Other | Source: Ambulatory Visit | Attending: Obstetrics and Gynecology | Admitting: Obstetrics and Gynecology

## 2019-06-23 DIAGNOSIS — Z20822 Contact with and (suspected) exposure to covid-19: Secondary | ICD-10-CM | POA: Insufficient documentation

## 2019-06-23 DIAGNOSIS — Z01812 Encounter for preprocedural laboratory examination: Secondary | ICD-10-CM | POA: Diagnosis not present

## 2019-06-23 LAB — SARS CORONAVIRUS 2 (TAT 6-24 HRS): SARS Coronavirus 2: NEGATIVE

## 2019-06-24 ENCOUNTER — Other Ambulatory Visit: Payer: Self-pay | Admitting: Obstetrics and Gynecology

## 2019-06-25 ENCOUNTER — Inpatient Hospital Stay (HOSPITAL_COMMUNITY)
Admission: AD | Admit: 2019-06-25 | Discharge: 2019-06-27 | DRG: 807 | Disposition: A | Discharge status: Home / Self Care | Payer: Medicaid Other | Attending: Obstetrics and Gynecology | Admitting: Obstetrics and Gynecology

## 2019-06-25 ENCOUNTER — Inpatient Hospital Stay (HOSPITAL_COMMUNITY): Payer: Medicaid Other

## 2019-06-25 ENCOUNTER — Other Ambulatory Visit: Payer: Self-pay

## 2019-06-25 ENCOUNTER — Encounter (HOSPITAL_COMMUNITY): Payer: Self-pay | Admitting: Obstetrics and Gynecology

## 2019-06-25 ENCOUNTER — Inpatient Hospital Stay (HOSPITAL_COMMUNITY): Payer: Medicaid Other | Admitting: Anesthesiology

## 2019-06-25 DIAGNOSIS — F329 Major depressive disorder, single episode, unspecified: Secondary | ICD-10-CM | POA: Diagnosis present

## 2019-06-25 DIAGNOSIS — O99344 Other mental disorders complicating childbirth: Secondary | ICD-10-CM | POA: Diagnosis present

## 2019-06-25 DIAGNOSIS — F419 Anxiety disorder, unspecified: Secondary | ICD-10-CM | POA: Diagnosis present

## 2019-06-25 DIAGNOSIS — Z3490 Encounter for supervision of normal pregnancy, unspecified, unspecified trimester: Secondary | ICD-10-CM | POA: Diagnosis present

## 2019-06-25 DIAGNOSIS — F418 Other specified anxiety disorders: Secondary | ICD-10-CM | POA: Diagnosis not present

## 2019-06-25 DIAGNOSIS — O99343 Other mental disorders complicating pregnancy, third trimester: Secondary | ICD-10-CM | POA: Diagnosis not present

## 2019-06-25 DIAGNOSIS — O1002 Pre-existing essential hypertension complicating childbirth: Secondary | ICD-10-CM | POA: Diagnosis present

## 2019-06-25 DIAGNOSIS — Z3A38 38 weeks gestation of pregnancy: Secondary | ICD-10-CM | POA: Diagnosis not present

## 2019-06-25 DIAGNOSIS — J45909 Unspecified asthma, uncomplicated: Secondary | ICD-10-CM | POA: Diagnosis not present

## 2019-06-25 DIAGNOSIS — Z3A39 39 weeks gestation of pregnancy: Secondary | ICD-10-CM | POA: Diagnosis not present

## 2019-06-25 DIAGNOSIS — I10 Essential (primary) hypertension: Secondary | ICD-10-CM

## 2019-06-25 DIAGNOSIS — O10013 Pre-existing essential hypertension complicating pregnancy, third trimester: Secondary | ICD-10-CM | POA: Diagnosis not present

## 2019-06-25 DIAGNOSIS — O164 Unspecified maternal hypertension, complicating childbirth: Secondary | ICD-10-CM | POA: Diagnosis not present

## 2019-06-25 DIAGNOSIS — O9952 Diseases of the respiratory system complicating childbirth: Secondary | ICD-10-CM | POA: Diagnosis not present

## 2019-06-25 LAB — COMPREHENSIVE METABOLIC PANEL
ALT: 16 U/L (ref 0–44)
AST: 18 U/L (ref 15–41)
Albumin: 2.8 g/dL — ABNORMAL LOW (ref 3.5–5.0)
Alkaline Phosphatase: 92 U/L (ref 38–126)
Anion gap: 10 (ref 5–15)
BUN: <5 mg/dL — ABNORMAL LOW (ref 6–20)
CO2: 21 mmol/L — ABNORMAL LOW (ref 22–32)
Calcium: 8.3 mg/dL — ABNORMAL LOW (ref 8.9–10.3)
Chloride: 106 mmol/L (ref 98–111)
Creatinine, Ser: 0.52 mg/dL (ref 0.44–1.00)
GFR calc Af Amer: >60 mL/min (ref >60)
GFR calc non Af Amer: >60 mL/min (ref >60)
Glucose, Bld: 85 mg/dL (ref 70–99)
Potassium: 3.7 mmol/L (ref 3.5–5.1)
Sodium: 137 mmol/L (ref 135–145)
Total Bilirubin: 0.7 mg/dL (ref 0.3–1.2)
Total Protein: 6.3 g/dL — ABNORMAL LOW (ref 6.5–8.1)

## 2019-06-25 LAB — TYPE AND SCREEN
ABO/RH(D): O POS
Antibody Screen: NEGATIVE

## 2019-06-25 LAB — CBC
HCT: 36.3 % (ref 36.0–46.0)
HCT: 35.9 % — ABNORMAL LOW (ref 36.0–46.0)
Hemoglobin: 12.4 g/dL (ref 12.0–15.0)
Hemoglobin: 12.7 g/dL (ref 12.0–15.0)
MCH: 31.1 pg (ref 26.0–34.0)
MCH: 31.1 pg (ref 26.0–34.0)
MCHC: 34.2 g/dL (ref 30.0–36.0)
MCHC: 35.4 g/dL (ref 30.0–36.0)
MCV: 91 fL (ref 80.0–100.0)
MCV: 88 fL (ref 80.0–100.0)
Platelets: 193 10*3/uL (ref 150–400)
Platelets: 182 10*3/uL (ref 150–400)
RBC: 3.99 MIL/uL (ref 3.87–5.11)
RBC: 4.08 MIL/uL (ref 3.87–5.11)
RDW: 13.3 % (ref 11.5–15.5)
RDW: 13.2 % (ref 11.5–15.5)
WBC: 7.5 10*3/uL (ref 4.0–10.5)
WBC: 17.3 10*3/uL — ABNORMAL HIGH (ref 4.0–10.5)
nRBC: 0 % (ref 0.0–0.2)
nRBC: 0 % (ref 0.0–0.2)

## 2019-06-25 LAB — URIC ACID: Uric Acid, Serum: 3.6 mg/dL (ref 2.5–7.1)

## 2019-06-25 LAB — ABO/RH: ABO/RH(D): O POS

## 2019-06-25 LAB — PROTEIN / CREATININE RATIO, URINE
Creatinine, Urine: 44.08 mg/dL
Total Protein, Urine: <6 mg/dL

## 2019-06-25 LAB — RPR: RPR Ser Ql: NONREACTIVE

## 2019-06-25 LAB — LACTATE DEHYDROGENASE: LDH: 145 U/L (ref 98–192)

## 2019-06-25 MED ORDER — OXYTOCIN 40 UNITS IN NORMAL SALINE INFUSION - SIMPLE MED
1.0000 m[IU]/min | INTRAVENOUS | Status: DC
  Administered 2019-06-25: 12:00:00 2 m[IU]/min via INTRAVENOUS
  Filled 2019-06-25: qty 1000

## 2019-06-25 MED ORDER — LIDOCAINE HCL (PF) 1 % IJ SOLN: 30.0000 mL | INTRAMUSCULAR | Status: DC | PRN

## 2019-06-25 MED ORDER — LIDOCAINE-EPINEPHRINE (PF) 2 %-1:200000 IJ SOLN
INTRAMUSCULAR | Status: DC | PRN
  Administered 2019-06-25: 3 mL via EPIDURAL

## 2019-06-25 MED ORDER — EPHEDRINE 5 MG/ML INJ: 10.0000 mg | INTRAVENOUS | Status: DC | PRN

## 2019-06-25 MED ORDER — PHENYLEPHRINE 40 MCG/ML (10ML) SYRINGE FOR IV PUSH (FOR BLOOD PRESSURE SUPPORT): 80.0000 ug | PREFILLED_SYRINGE | INTRAVENOUS | Status: DC | PRN

## 2019-06-25 MED ORDER — DIPHENHYDRAMINE HCL 50 MG/ML IJ SOLN: 12.5000 mg | INTRAMUSCULAR | Status: DC | PRN

## 2019-06-25 MED ORDER — ACETAMINOPHEN 325 MG PO TABS: 650.0000 mg | ORAL_TABLET | ORAL | Status: DC | PRN

## 2019-06-25 MED ORDER — FENTANYL-BUPIVACAINE-NACL 0.5-0.125-0.9 MG/250ML-% EP SOLN
12.0000 mL/h | EPIDURAL | Status: DC | PRN
  Filled 2019-06-25: qty 250

## 2019-06-25 MED ORDER — ONDANSETRON HCL 4 MG/2ML IJ SOLN: 4.0000 mg | Freq: Four times a day (QID) | INTRAMUSCULAR | Status: DC | PRN

## 2019-06-25 MED ORDER — OXYTOCIN BOLUS FROM INFUSION
500.0000 mL | Freq: Once | INTRAVENOUS | Status: AC
  Administered 2019-06-26: 500 mL via INTRAVENOUS

## 2019-06-25 MED ORDER — TERBUTALINE SULFATE 1 MG/ML IJ SOLN: 0.2500 mg | Freq: Once | INTRAMUSCULAR | Status: DC | PRN

## 2019-06-25 MED ORDER — LACTATED RINGERS IV SOLN
500.0000 mL | Freq: Once | INTRAVENOUS | Status: AC
  Administered 2019-06-25: 500 mL via INTRAVENOUS

## 2019-06-25 MED ORDER — LACTATED RINGERS IV SOLN: INTRAVENOUS | Status: DC

## 2019-06-25 MED ORDER — OXYCODONE-ACETAMINOPHEN 5-325 MG PO TABS: 2.0000 | ORAL_TABLET | ORAL | Status: DC | PRN

## 2019-06-25 MED ORDER — SOD CITRATE-CITRIC ACID 500-334 MG/5ML PO SOLN: 30.0000 mL | ORAL | Status: DC | PRN

## 2019-06-25 MED ORDER — FLEET ENEMA 7-19 GM/118ML RE ENEM: 1.0000 | ENEMA | RECTAL | Status: DC | PRN

## 2019-06-25 MED ORDER — OXYTOCIN 40 UNITS IN NORMAL SALINE INFUSION - SIMPLE MED
2.5000 [IU]/h | INTRAVENOUS | Status: DC
  Administered 2019-06-26: 2.5 [IU]/h via INTRAVENOUS

## 2019-06-25 MED ORDER — BUPIVACAINE HCL (PF) 0.25 % IJ SOLN
INTRAMUSCULAR | Status: DC | PRN
  Administered 2019-06-25 (×2): 4 mL via EPIDURAL

## 2019-06-25 MED ORDER — SODIUM CHLORIDE (PF) 0.9 % IJ SOLN
INTRAMUSCULAR | Status: DC | PRN
  Administered 2019-06-25: 12 mL/h via EPIDURAL

## 2019-06-25 MED ORDER — LACTATED RINGERS IV SOLN
500.0000 mL | INTRAVENOUS | Status: DC | PRN
  Administered 2019-06-25: 1000 mL via INTRAVENOUS

## 2019-06-25 MED ORDER — STERILE WATER FOR INJECTION IJ SOLN
20.0000 mL | Freq: Once | INTRAMUSCULAR | Status: AC
  Administered 2019-06-25: 20:00:00 20 mL via INTRAMUSCULAR
  Filled 2019-06-25: qty 20

## 2019-06-25 MED ORDER — OXYCODONE-ACETAMINOPHEN 5-325 MG PO TABS: 1.0000 | ORAL_TABLET | ORAL | Status: DC | PRN

## 2019-06-25 NOTE — Progress Notes (Signed)
Subjective: Pt complains of back pain.  Breathing with contractions.  Changing positions to help alleviate it.   Objective: BP (!) 127/92   Pulse 96   Temp 98.5 F (36.9 C) (Oral)   Resp 16   Ht 5\' 1"  (1.549 m)   Wt 87.5 kg   LMP 09/29/2018 (Exact Date)   BMI 36.43 kg/m  No intake/output data recorded. No intake/output data recorded.  FHT: Category 1 FHT 135 accels, no decels, variability present UC:   regular, every 2-4 minutes SVE:   Dilation: 6 Effacement (%): 70 Station: -2 Exam by:: [email or web address], CNM Pitocin at 6 mu SWP discussed risk and benefits. Procedure explained.  4 papules done using sterile water making a 3 cm bleb on her back with 2 on each side.  Pt tolerated the procedure well.    Assessment:  Pt is a 34 yo G4P3003 at 38.3 IUP induction of labor for [facility] Cat 1 strip  Plan: Monitor progress Anticipate SVD  [facility] CNM, MSN 06/25/2019, 8:01 PM

## 2019-06-25 NOTE — Progress Notes (Signed)
Labor Progress Note  Subjective: Sandy Townsend, 34 y.o., [phone], with an IUP @ [redacted]w[redacted]d, presented for  Patient Active Problem List   Diagnosis Date Noted  . Encounter for induction of labor 06/25/2019  . Supervision of high risk pregnancy, antepartum 12/23/2018  . Allergic rhinitis 07/09/2018  . Contraception management 07/09/2018  . Essential hypertension 07/09/2018  . Urge incontinence 01/24/2018  . Major depression, recurrent (HCC) 12/30/2017   Objective: BP (!) 135/102   Pulse 98   Temp 98.6 F (37 C) (Oral)   Resp 17   Ht 5\' 1"  (1.549 m)   Wt 87.5 kg   LMP 09/29/2018 (Exact Date)   BMI 36.43 kg/m  No intake/output data recorded. No intake/output data recorded. NST: FHR baseline 140 bpm, Variability: moderate, Accelerations:present, Decelerations:  Absent= Cat 1/Reactive CTX:  regular, every 2-3 minutes Uterus gravid, soft non tender, moderate to palpate with contractions.  SVE:  Dilation: 4 Effacement (%): 70 Station: -2 Exam by:: [email or web address], CNM Pitocin at 63mUn/min  Assessment:  Sandy Townsend, 34 y.o., [phone], with an IUP @ [redacted]w[redacted]d, presented for  Patient Active Problem List   Diagnosis Date Noted  . Encounter for induction of labor 06/25/2019  . Supervision of high risk pregnancy, antepartum 12/23/2018  . Allergic rhinitis 07/09/2018  . Contraception management 07/09/2018  . Essential hypertension 07/09/2018  . Urge incontinence 01/24/2018  . Major depression, recurrent (HCC) 12/30/2017   IOL d/t chronic hypertension NICHD: Category 1 Membranes: AROM x 1707, clear fluid no s/s of infection Induction:    Foley Bulb: inserted @1132 , out at 1507  Pitocin - 28mu - contractions every 2-3 minutes Does not desires epidural or IV pain medication at this time GBS negative PIH labs: CBC plts 193, AST 18, ALT 16, and PC ratio pending  Plan: Continue labor plan Continuous monitoring Ambulate Frequent position changes to facilitate fetal rotation and  descent. Will reassess with cervical exam at 1900 or earlier if necessary Continue pitocin per protocol Anticipate labor progression and vaginal delivery.   Dr. aware of plan and verbalized agreement.   4m, CNM, MSN 06/25/2019. 5:24 PM

## 2019-06-25 NOTE — Progress Notes (Signed)
RN attempted to increase pitocin, take patient temp and increase pitocin,patient on toilet and requests to labor on the toilet so RN unable to perform tasks at this time

## 2019-06-25 NOTE — H&P (Signed)
Sandy Townsend is a 34 y.o. female, G4P3003, IUP at 38.3 weeks, presenting for IOL due to chronic hypertension. Pt endorse + fetal movement. Denies vaginal leakage. Denies vaginal bleeding. States she feels occasional contractions. Pt takes Labetalol 100mg  daily for hypertension as well as Wellbutrin and Zoloft for a history of depression and anxiety. Denies HA, visual changes or epigastric pain. Pt is excited to welcome a baby girl and has a doula/friend as her support person.   Patient Active Problem List   Diagnosis Date Noted  . Encounter for induction of labor 06/25/2019  . Supervision of high risk pregnancy, antepartum 12/23/2018  . Allergic rhinitis 07/09/2018  . Contraception management 07/09/2018  . Essential hypertension 07/09/2018  . Urge incontinence 01/24/2018  . Major depression, recurrent (HCC) 12/30/2017   Medications Prior to Admission  Medication Sig Dispense Refill Last Dose  . buPROPion (WELLBUTRIN SR) 150 MG 12 hr tablet Take 150 mg by mouth 2 (two) times daily.     03/01/2018 labetalol (NORMODYNE) 100 MG tablet Take 1 tablet (100 mg total) by mouth 2 (two) times daily. (Patient taking differently: Take 100 mg by mouth once. ) 60 tablet 1   . pantoprazole (PROTONIX) 20 MG tablet Take 1 tablet (20 mg total) by mouth 2 (two) times daily. (Patient taking differently: Take 20 mg by mouth daily as needed. ) 60 tablet 1   . Prenatal Vit-Fe Fumarate-FA (MULTIVITAMIN-PRENATAL) 27-0.8 MG TABS tablet Take 1 tablet by mouth daily at 12 noon.     . sertraline (ZOLOFT) 50 MG tablet Take 1 tablet (50 mg total) by mouth daily. 30 tablet 0     Past Medical History:  Diagnosis Date  . Anxiety   . Depression   . Exposure to TB   . Heart murmur   . Hypertension     No current facility-administered medications on file prior to encounter.   Current Outpatient Medications on File Prior to Encounter  Medication Sig Dispense Refill  . labetalol (NORMODYNE) 100 MG tablet Take 1 tablet (100 mg  total) by mouth 2 (two) times daily. (Patient taking differently: Take 100 mg by mouth once. ) 60 tablet 1  . pantoprazole (PROTONIX) 20 MG tablet Take 1 tablet (20 mg total) by mouth 2 (two) times daily. (Patient taking differently: Take 20 mg by mouth daily as needed. ) 60 tablet 1  . Prenatal Vit-Fe Fumarate-FA (MULTIVITAMIN-PRENATAL) 27-0.8 MG TABS tablet Take 1 tablet by mouth daily at 12 noon.    . sertraline (ZOLOFT) 50 MG tablet Take 1 tablet (50 mg total) by mouth daily. 30 tablet 0     Allergies  Allergen Reactions  . Iodine Anaphylaxis  . Shellfish Allergy Anaphylaxis and Swelling  . Pollen Extract Itching    History of present pregnancy: Pt Info/Preference:  Screening/Consents:  Labs:   EDD: Estimated Date of Delivery: 07/06/19  Establised: Patient's last menstrual period was 09/29/2018 (exact date).  Anatomy Scan:  Date: 02/18/19 Placenta Location: anterior Genetic Screen: neg Panoroma: Low risk female AFP:  First Tri: Quad:  Office: [facility]          First PNV: [redacted]w[redacted]d Blood Type O/Positive/-- (09/17 0000)  Language: [birth year]1983 Last PNV: [redacted]w[redacted]d Rhogam    Flu Vaccine:  No   Antibody Negative (09/17 0000)  TDaP vaccine Yes   GTT: Early: 4.8 Third Trimester: 138  Normal 3 hour  Feeding Plan: Breast/bottle BTL: No Rubella: Immune (09/17 0000)  Contraception: undecided VBAC: No RPR: Nonreactive (09/17 0000)   Circumcision: N/A  HBsAg: Negative (09/17 0000)  Pediatrician:  ???   HIV: Non-reactive (09/17 0000)   Prenatal Classes: No Additional [place]: Growth 3/12 see below GBS: Negative      Chlamydia: neg    MFM Referral/Consult:  GC: neg  Support Person: Family member   PAP: 2019  Pain Management: Epidural Neonatologist Referral:  Hgb Electrophoresis:  AS  Birth Plan: No   Hgb NOB: 13.4   28W: 12.0   U/S 06/10/19  SINGLETON PREGNANCY. VERTEX PRESENTATION. ANTERIOR PLACENTA. CERVIX NOT SEEN PER PROTOCOL. AMNIOTIC FLUID APPEARS NORMAL. AFI=12.0CM. JJK=0938H, 6 POUNDS 8 OZ,  46% LAGGING BPD=9% HC=16%  BPP 8/8 IN 5 MINUTES OB History    Gravida  4   Para  3   Term  3   Preterm      AB      Living  3     SAB      TAB      Ectopic      Multiple      Live Births  3          Past Surgical History:  Procedure Laterality Date  . WISDOM TOOTH EXTRACTION     Family History: family history includes Diabetes in her mother, paternal grandfather, and paternal grandmother; Hypercalcemia in her mother; Hypertension in her father and mother. Social History:  reports that she has never smoked. She has never used smokeless tobacco. She reports previous alcohol use of about 2.0 standard drinks of alcohol per week. She reports that she does not use drugs.  Prenatal Transfer Tool  Maternal Diabetes: No Genetic Screening: Normal Maternal Ultrasounds/Referrals: Normal Fetal Ultrasounds or other Referrals:  None Maternal Substance Abuse:  No Significant Maternal Medications:  Meds include: Zoloft Protonix Significant Maternal Lab Results: None  ROS:  Review of Systems  Constitutional: Negative for chills and fever.  HENT: Negative for congestion and sore throat.   Eyes: Negative for blurred vision and double vision.  Respiratory: Negative for cough and shortness of breath.   Cardiovascular: Negative for chest pain and leg swelling.  Gastrointestinal: Positive for constipation. Negative for abdominal pain.  Genitourinary: Negative for frequency and urgency.  Musculoskeletal: Positive for back pain.  Neurological: Negative for weakness and headaches.  Psychiatric/Behavioral: Negative for depression. The patient is nervous/anxious.     Physical Exam: LMP 09/29/2018 (Exact Date)   Physical Exam  Constitutional: She is oriented to person, place, and time and well-developed, well-nourished, and in no distress.  HENT:  Head: Normocephalic.  Eyes: Pupils are equal, round, and reactive to light.  Cardiovascular: Normal rate and regular rhythm.   Pulmonary/Chest: Effort normal and breath sounds normal.  Abdominal: Soft. Bowel sounds are normal.  Genitourinary:    Vagina and cervix normal.   Musculoskeletal:        General: Normal range of motion.     Cervical back: Normal range of motion and neck supple.  Neurological: She is alert and oriented to person, place, and time.  Skin: Skin is warm and dry.  Psychiatric: Affect normal.   NST: FHR baseline 135 bpm, Variability: moderate, Accelerations:present, Decelerations:  Absent= Cat 1/Reactive UC:   none SVE:  1/50/-3, vertex verified by fetal sutures.  Leopold's: Position vertex, EFW 7lbs via leopold's.   Assessment/Plan: Sandy Townsend is a 34 y.o. female, G4P3003, IUP at 38.1 weeks, presenting for IOL due to chronic hypertension.   FWB: Cat 1 Fetal Tracing.   Plan: Admit to Birthing Suite per consult with Dr. Richardson Dopp Routine [facility] orders  Pain med/epidural prn PIH labs drawn Cook catheter placed without difficulty and low dose Pitocin started Will examine cervix again at 1530 or sooner if needed Anticipate labor progression and vaginal delivery.  Dr. Landry Mellow aware of plan of care and verbalizes agreement.   Suzan Nailer, CNM, MSN 06/25/2019, 9:48 AM

## 2019-06-25 NOTE — Progress Notes (Signed)
Subjective: Pt is doing well breathing with contractions.  Doula in room  Objective: BP 132/84   Pulse 92   Temp 98.2 F (36.8 C) (Oral)   Resp 16   Ht 5\' 1"  (1.549 m)   Wt 87.5 kg   LMP 09/29/2018 (Exact Date)   BMI 36.43 kg/m  No intake/output data recorded. No intake/output data recorded.  FHT: Category 1 FHT 130 accels noted occ early decel UC:   regular, every 2-4 minutes SVE:   Dilation: 6 Effacement (%): 70 Station: -2 Exam by:: [email or web address], CNM Pitocin at 5 mu   Assessment:  Pt is a 34 yo G4P3003 at 38.3 IUP induction of labor for [facility] Cat 1 strip   Plan: Anticipate SVD Pt desires NCB  [facility] CNM, MSN 06/25/2019, 7:25 PM

## 2019-06-25 NOTE — Anesthesia Procedure Notes (Signed)
Epidural Patient location during procedure: OR Start time: 06/25/2019 10:10 PM End time: 06/25/2019 10:26 PM  Staffing Anesthesiologist: Val Eagle, MD Performed: anesthesiologist   Preanesthetic Checklist Completed: patient identified, IV checked, risks and benefits discussed, surgical consent, monitors and equipment checked, pre-op evaluation and timeout performed  Epidural Patient position: sitting Prep: ChloraPrep Patient monitoring: heart rate, continuous pulse ox and blood pressure Approach: midline Location: L3-L4 Injection technique: LOR saline  Needle:  Needle type: Tuohy  Needle gauge: 17 G Needle length: 9 cm Needle insertion depth: 7 cm Catheter type: closed end flexible Catheter size: 19 Gauge Catheter at skin depth: 12 cm Test dose: negative and 2% lidocaine with Epi 1:200 K  Additional Notes Reason for block:at surgeon's request and procedure for pain

## 2019-06-25 NOTE — Anesthesia Preprocedure Evaluation (Addendum)
Anesthesia Evaluation  Patient identified by MRN, date of birth, ID band Patient awake    Reviewed: Allergy & Precautions, Patient's Chart, lab work & pertinent test results  History of Anesthesia Complications Negative for: history of anesthetic complications  Airway Mallampati: II  TM Distance: >3 FB Neck ROM: Full    Dental  (+) Dental Advisory Given   Pulmonary asthma , neg recent URI,    breath sounds clear to auscultation       Cardiovascular hypertension, Pt. on medications and Pt. on home beta blockers  Rhythm:Regular     Neuro/Psych PSYCHIATRIC DISORDERS Anxiety Depression    GI/Hepatic negative GI ROS, Neg liver ROS,   Endo/Other  negative endocrine ROS  Renal/GU      Musculoskeletal   Abdominal (+) + obese,   Peds  Hematology Hgb 12.7 Plt 182   Anesthesia Other Findings   Reproductive/Obstetrics (+) Pregnancy                           Anesthesia Physical Anesthesia Plan  ASA: III  Anesthesia Plan: Epidural   Post-op Pain Management:    Induction:   PONV Risk Score and Plan: 2 and Treatment may vary due to age or medical condition  Airway Management Planned:   Additional Equipment: Fetal Monitoring  Intra-op Plan:   Post-operative Plan:   Informed Consent: I have reviewed the patients History and Physical, chart, labs and discussed the procedure including the risks, benefits and alternatives for the proposed anesthesia with the patient or authorized representative who has indicated his/her understanding and acceptance.       Plan Discussed with:   Anesthesia Plan Comments: (38.3 w G4P3 w hx of c Htn, Asthma for LEA)      Anesthesia Quick Evaluation

## 2019-06-26 ENCOUNTER — Encounter (HOSPITAL_COMMUNITY): Payer: Self-pay | Admitting: Obstetrics and Gynecology

## 2019-06-26 DIAGNOSIS — Z3A38 38 weeks gestation of pregnancy: Secondary | ICD-10-CM

## 2019-06-26 DIAGNOSIS — F418 Other specified anxiety disorders: Secondary | ICD-10-CM

## 2019-06-26 DIAGNOSIS — O99343 Other mental disorders complicating pregnancy, third trimester: Secondary | ICD-10-CM

## 2019-06-26 DIAGNOSIS — O10013 Pre-existing essential hypertension complicating pregnancy, third trimester: Secondary | ICD-10-CM

## 2019-06-26 LAB — CBC
HCT: 36.9 % (ref 36.0–46.0)
Hemoglobin: 12.6 g/dL (ref 12.0–15.0)
MCH: 31 pg (ref 26.0–34.0)
MCHC: 34.1 g/dL (ref 30.0–36.0)
MCV: 90.7 fL (ref 80.0–100.0)
Platelets: 175 10*3/uL (ref 150–400)
RBC: 4.07 MIL/uL (ref 3.87–5.11)
RDW: 13.4 % (ref 11.5–15.5)
WBC: 20.8 10*3/uL — ABNORMAL HIGH (ref 4.0–10.5)
nRBC: 0 % (ref 0.0–0.2)

## 2019-06-26 MED ORDER — LABETALOL HCL 100 MG PO TABS
100.0000 mg | ORAL_TABLET | Freq: Once | ORAL | Status: AC
  Administered 2019-06-26: 100 mg via ORAL
  Filled 2019-06-26: qty 1

## 2019-06-26 MED ORDER — ACETAMINOPHEN 325 MG PO TABS
650.0000 mg | ORAL_TABLET | ORAL | Status: DC | PRN
  Administered 2019-06-26 – 2019-06-27 (×2): 650 mg via ORAL
  Filled 2019-06-26 (×2): qty 2

## 2019-06-26 MED ORDER — BENZOCAINE-MENTHOL 20-0.5 % EX AERO: 1.0000 "application " | INHALATION_SPRAY | CUTANEOUS | Status: DC | PRN

## 2019-06-26 MED ORDER — SIMETHICONE 80 MG PO CHEW: 80.0000 mg | CHEWABLE_TABLET | ORAL | Status: DC | PRN

## 2019-06-26 MED ORDER — WITCH HAZEL-GLYCERIN EX PADS: 1.0000 "application " | MEDICATED_PAD | CUTANEOUS | Status: DC | PRN

## 2019-06-26 MED ORDER — LABETALOL HCL 100 MG PO TABS
100.0000 mg | ORAL_TABLET | Freq: Two times a day (BID) | ORAL | Status: DC
  Administered 2019-06-26 – 2019-06-27 (×3): 100 mg via ORAL
  Filled 2019-06-26 (×3): qty 1

## 2019-06-26 MED ORDER — TETANUS-DIPHTH-ACELL PERTUSSIS 5-2.5-18.5 LF-MCG/0.5 IM SUSP: 0.5000 mL | Freq: Once | INTRAMUSCULAR | Status: DC

## 2019-06-26 MED ORDER — ONDANSETRON HCL 4 MG/2ML IJ SOLN: 4.0000 mg | INTRAMUSCULAR | Status: DC | PRN

## 2019-06-26 MED ORDER — BUPROPION HCL ER (SR) 150 MG PO TB12
150.0000 mg | ORAL_TABLET | Freq: Two times a day (BID) | ORAL | Status: DC
  Administered 2019-06-26 – 2019-06-27 (×3): 150 mg via ORAL
  Filled 2019-06-26 (×4): qty 1

## 2019-06-26 MED ORDER — ZOLPIDEM TARTRATE 5 MG PO TABS: 5.0000 mg | ORAL_TABLET | Freq: Every evening | ORAL | Status: DC | PRN

## 2019-06-26 MED ORDER — DIBUCAINE (PERIANAL) 1 % EX OINT: 1.0000 "application " | TOPICAL_OINTMENT | CUTANEOUS | Status: DC | PRN

## 2019-06-26 MED ORDER — DIPHENHYDRAMINE HCL 25 MG PO CAPS: 25.0000 mg | ORAL_CAPSULE | Freq: Four times a day (QID) | ORAL | Status: DC | PRN

## 2019-06-26 MED ORDER — COCONUT OIL OIL
1.0000 "application " | TOPICAL_OIL | Status: DC | PRN
  Administered 2019-06-26: 1 via TOPICAL

## 2019-06-26 MED ORDER — IBUPROFEN 600 MG PO TABS
600.0000 mg | ORAL_TABLET | Freq: Four times a day (QID) | ORAL | Status: DC
  Administered 2019-06-26 – 2019-06-27 (×6): 600 mg via ORAL
  Filled 2019-06-26 (×6): qty 1

## 2019-06-26 MED ORDER — SENNOSIDES-DOCUSATE SODIUM 8.6-50 MG PO TABS
2.0000 | ORAL_TABLET | ORAL | Status: DC
  Administered 2019-06-26: 2 via ORAL
  Filled 2019-06-26: qty 2

## 2019-06-26 MED ORDER — ONDANSETRON HCL 4 MG PO TABS: 4.0000 mg | ORAL_TABLET | ORAL | Status: DC | PRN

## 2019-06-26 MED ORDER — SERTRALINE HCL 50 MG PO TABS
50.0000 mg | ORAL_TABLET | Freq: Every day | ORAL | Status: DC
  Filled 2019-06-26 (×2): qty 1

## 2019-06-26 MED ORDER — PRENATAL MULTIVITAMIN CH
1.0000 | ORAL_TABLET | Freq: Every day | ORAL | Status: DC
  Administered 2019-06-26 – 2019-06-27 (×2): 1 via ORAL
  Filled 2019-06-26 (×2): qty 1

## 2019-06-26 NOTE — Lactation Note (Signed)
This note was copied from a baby's chart. Lactation Consultation Note  Patient Name: Sandy Townsend [id number] Date: 06/26/2019 Reason for consult: Follow-up assessment;Term;1st time breastfeeding  P4 mother whose infant is now 67 hours old.  This is a term baby at 39+1 weeks.  Mother did not breast feed her other three children.  Baby was asleep at mother's side when I arrived.  Offered to assist with latching and mother agreeable.  Reviewed breast feeding basics including STS, feeding cues, hand expression, feeding any EBM back to baby, how to obtain a deep latch and proper positioning.  Mother's breasts are soft and non tender and nipples are everted and intact.  Mother did a return demonstration of hand expression and was able to express a few drops which I finger fed back to baby.  Assisted to latch in the football hold on the right side easily.  Baby had a wide gape and flanged lips.  With constant stimulation she took a few sucks and would then fall asleep.  Demonstrated breast compressions and gentle stimulation and mother will continue to practice both of these techniques.  Observed baby feeding intermittently for 10 minutes.  Removed her from the breast and placed her STS on mother's chest.  Mother will continue feeding 8-12 times/24 hours or sooner if baby shows feeding cues.  She will feed any EBM back to baby.  Mother has a DEBP for home use and will call for latch assistance as needed.  No support person present at this time.  RN updated.   Maternal Data Formula Feeding for Exclusion: No Has patient been taught Hand Expression?: Yes Does the patient have breastfeeding experience prior to this delivery?: No  Feeding Feeding Type: Breast Fed  LATCH Score Latch: Repeated attempts needed to sustain latch, nipple held in mouth throughout feeding, stimulation needed to elicit sucking reflex.  Audible Swallowing: None  Type of Nipple: Everted at rest and after  stimulation  Comfort (Breast/Nipple): Soft / non-tender  Hold (Positioning): Assistance needed to correctly position infant at breast and maintain latch.  LATCH Score: 6  Interventions Interventions: Breast feeding basics reviewed;Assisted with latch;Skin to skin;Breast massage;Hand express;Breast compression;Adjust position;Position options;Support pillows  Lactation Tools Discussed/Used     Consult Status Consult Status: Follow-up Date: 06/27/19 Follow-up type: In-patient    Milaya Hora R Marika Mahaffy 06/26/2019, 1:47 PM

## 2019-06-26 NOTE — Lactation Note (Signed)
This note was copied from a baby's chart. Lactation Consultation Note  Patient Name: Sandy Townsend LOVFI'E Date: 06/26/2019 Reason for consult: Initial assessment;1st time breastfeeding;Term P4, term 4 hour female infant. Mom with hx of depression and anxiety.  Infant had one void since birth. Per mom, she feels breastfeeding is going well no concerns at this time, infant latched twice in L&D and in the room for 20 minutes each feeding. LC did not observe latch at this time and infant is asleep in basinet. This is mom's first time breastfeeding she did not breastfeed her other 3 children. Mom is active on the [facility] program in [facility] and has DEBP at home. LC discussed hand expression, mom taught back and easily expressed 5 mls of colostrum in bullet that she will offer infant on a spoon at next feeding, after latching infant at breast. Mom knows to breastfeed infant according to hunger cues, 8 to 12 times within 24 hours and not exceed 3 hours without breastfeeding infant. Mom will do as much STS as possible. Mom knows to call RN or LC  if she has any questions, concerns or need assistance with latching infant at breast.  Reviewed Baby & Me book's Breastfeeding Basics.  LC discussed breastfeeding resources: LC hotline, LC outpatient clinic and LC online breastfeeding support group.     Maternal Data Formula Feeding for Exclusion: No Has patient been taught Hand Expression?: Yes Does the patient have breastfeeding experience prior to this delivery?: No  Feeding Feeding Type: Breast Fed  LATCH Score Latch: Grasps breast easily, tongue down, lips flanged, rhythmical sucking.  Audible Swallowing: A few with stimulation  Type of Nipple: Everted at rest and after stimulation  Comfort (Breast/Nipple): Soft / non-tender  Hold (Positioning): Assistance needed to correctly position infant at breast and maintain latch.  LATCH Score: 8  Interventions Interventions: Breast  feeding basics reviewed;Skin to skin;DEBP;Hand express;Expressed milk  Lactation Tools Discussed/Used WIC Program: Yes   Consult Status Consult Status: Follow-up Date: 06/26/19 Follow-up type: In-patient    Danelle Earthly 06/26/2019, 5:24 AM

## 2019-06-26 NOTE — Anesthesia Postprocedure Evaluation (Signed)
Anesthesia Post Note  Patient: Sandy Townsend  Procedure(s) Performed: AN AD HOC LABOR EPIDURAL     Patient location during evaluation: Mother Baby Anesthesia Type: Epidural Level of consciousness: awake Pain management: satisfactory to patient Vital Signs Assessment: post-procedure vital signs reviewed and stable Respiratory status: spontaneous breathing Cardiovascular status: stable Anesthetic complications: no    Last Vitals:  Vitals:   06/26/19 0350 06/26/19 0432  BP: (!) 134/97 (!) 139/92  Pulse: 96 84  Resp: 18 18  Temp: 37.1 C 36.6 C  SpO2: 99%     Last Pain:  Vitals:   06/26/19 0730  TempSrc:   PainSc: 0-No pain   Pain Goal:                Epidural/Spinal Function Cutaneous sensation: Normal sensation (06/26/19 0827)  Cephus Shelling

## 2019-06-26 NOTE — Anesthesia Postprocedure Evaluation (Signed)
Anesthesia Post Note  Patient: Liana Crocker  Procedure(s) Performed: AN AD HOC LABOR EPIDURAL     Patient location during evaluation: Mother Baby Anesthesia Type: Epidural Level of consciousness: awake and alert Pain management: pain level controlled Vital Signs Assessment: post-procedure vital signs reviewed and stable Respiratory status: spontaneous breathing, nonlabored ventilation and respiratory function stable Cardiovascular status: stable Postop Assessment: no headache, no backache, epidural receding, patient able to bend at knees, no apparent nausea or vomiting, adequate PO intake and able to ambulate Anesthetic complications: no    Last Vitals:  Vitals:   06/26/19 0350 06/26/19 0432  BP: (!) 134/97 (!) 139/92  Pulse: 96 84  Resp: 18 18  Temp: 37.1 C 36.6 C  SpO2: 99%     Last Pain:  Vitals:   06/26/19 0730  TempSrc:   PainSc: 0-No pain   Pain Goal:                Epidural/Spinal Function Cutaneous sensation: Normal sensation (06/26/19 0827)  Laban Emperor

## 2019-06-27 DIAGNOSIS — I10 Essential (primary) hypertension: Secondary | ICD-10-CM

## 2019-06-27 LAB — CBC
HCT: 30.9 % — ABNORMAL LOW (ref 36.0–46.0)
Hemoglobin: 10.7 g/dL — ABNORMAL LOW (ref 12.0–15.0)
MCH: 31.4 pg (ref 26.0–34.0)
MCHC: 34.6 g/dL (ref 30.0–36.0)
MCV: 90.6 fL (ref 80.0–100.0)
Platelets: 165 10*3/uL (ref 150–400)
RBC: 3.41 MIL/uL — ABNORMAL LOW (ref 3.87–5.11)
RDW: 13.8 % (ref 11.5–15.5)
WBC: 14.1 10*3/uL — ABNORMAL HIGH (ref 4.0–10.5)
nRBC: 0 % (ref 0.0–0.2)

## 2019-06-27 MED ORDER — LABETALOL HCL 100 MG PO TABS: 100.0000 mg | ORAL_TABLET | Freq: Two times a day (BID) | ORAL | 1 refills | Status: DC

## 2019-06-27 MED ORDER — IBUPROFEN 600 MG PO TABS: 600.0000 mg | ORAL_TABLET | Freq: Four times a day (QID) | ORAL | 0 refills | Status: DC

## 2019-06-27 NOTE — Progress Notes (Signed)
MOB was referred for history of depression/anxiety.  * Referral screened out by Clinical Social Worker because none of the following criteria appear to apply:  ~ History of anxiety/depression during this pregnancy, or of post-partum depression following prior delivery. ~ Diagnosis of anxiety and/or depression within last 3 years OR * MOB's symptoms currently being treated with medication and/or therapy. MOB currently taking zoloft and welbutrin.  Please contact the Clinical Social Worker if needs arise, by MOB request, or if MOB scores greater than 9/yes to question 10 on Edinburgh Postpartum Depression Screen.  Sandy Vandenberghe, LCSW [facility] [phone]  

## 2019-06-27 NOTE — Discharge Summary (Signed)
Postpartum Discharge Summary     Patient Name: Sandy Townsend DOB: [birth year] MRN: [id number]  Date of admission: 06/25/2019 Delivering Provider: Irene Shipper, CNM Date of discharge: 06/27/2019  Admitting diagnosis: Encounter for induction of labor [Z34.90] Intrauterine pregnancy: [redacted]w[redacted]d    Secondary diagnosis:  Active Problems:   Encounter for induction of labor   SVD (spontaneous vaginal delivery)   Chronic hypertension      Discharge diagnosis: Term Pregnancy Delivered                                                                                                Post partum procedures:NA  Augmentation: AROM, Pitocin and Foley Balloon  Complications: None  Hospital course:  Induction of Labor With Vaginal Delivery   34y.o. yo [id number] [id number] admitted to the hospital 06/25/2019 for induction of labor.  Indication for induction: CHTN.  Patient had an uncomplicated labor course as follows: Membrane Rupture Time/Date: 5:07 PM ,06/25/2019   Intrapartum Procedures: Episiotomy: None [1]                                         Lacerations:  None [1]  Patient had delivery of a Viable infant.  Information for the patient's newborn:  ClMarriana, Hibberd0[[id number]]Delivery Method: Vaginal, Spontaneous(Filed from Delivery Summary)    06/26/2019  Details of delivery can be found in separate delivery note.  Patient had a routine postpartum course. Patient is discharged home 06/27/19. Delivery time: 12:54 AM    Magnesium Sulfate received: No BMZ received: No Rhophylac:N/A MMR:N/A Transfusion:No  Physical exam  Vitals:   06/26/19 1318 06/26/19 1820 06/26/19 2254 06/27/19 0531  BP: 115/76 104/67 115/81 115/82  Pulse: 89 92 87 82  Resp: 16 20  16   Temp: 98.1 F (36.7 C) 97.9 F (36.6 C)  97.9 F (36.6 C)  TempSrc: Oral   Oral  SpO2: 99%   98%  Weight:      Height:       General: alert, cooperative and no distress Lochia: appropriate Uterine Fundus: firm Incision:  N/A DVT Evaluation: No evidence of DVT seen on physical exam. Negative Homan's sign. No cords or calf tenderness. No significant calf/ankle edema. Labs: Lab Results  Component Value Date   WBC 14.1 (H) 06/27/2019   HGB 10.7 (L) 06/27/2019   HCT 30.9 (L) 06/27/2019   MCV 90.6 06/27/2019   PLT 165 06/27/2019   CMP Latest Ref Rng & Units 06/25/2019  Glucose 70 - 99 mg/dL 85  BUN 6 - 20 mg/dL <5(L)  Creatinine 0.44 - 1.00 mg/dL 0.52  Sodium 135 - 145 mmol/L 137  Potassium 3.5 - 5.1 mmol/L 3.7  Chloride 98 - 111 mmol/L 106  CO2 22 - 32 mmol/L 21(L)  Calcium 8.9 - 10.3 mg/dL 8.3(L)  Total Protein 6.5 - 8.1 g/dL 6.3(L)  Total Bilirubin 0.3 - 1.2 mg/dL 0.7  Alkaline Phos 38 - 126 U/L 92  AST 15 - 41 U/L 18  ALT  0 - 44 U/L 16   Edinburgh Score: No flowsheet data found.  Discharge instruction: per After Visit Summary and "Baby and Me Booklet".  After visit meds:  Allergies as of 06/27/2019      Reactions   Iodine Anaphylaxis   Shellfish Allergy Anaphylaxis, Swelling   Pollen Extract Itching      Medication List    STOP taking these medications   buPROPion 150 MG 12 hr tablet Commonly known as: WELLBUTRIN SR   calcium carbonate 500 MG chewable tablet Commonly known as: TUMS - dosed in mg elemental calcium   pantoprazole 20 MG tablet Commonly known as: PROTONIX   prenatal multivitamin Tabs tablet     TAKE these medications   ibuprofen 600 MG tablet Commonly known as: ADVIL Take 1 tablet (600 mg total) by mouth every 6 (six) hours.   labetalol 100 MG tablet Commonly known as: NORMODYNE Take 1 tablet (100 mg total) by mouth 2 (two) times daily. What changed: when to take this       Diet: routine diet  Activity: Advance as tolerated. Pelvic rest for 6 weeks.   Outpatient follow up:1 week BP check Follow up Appt:No future appointments. Follow up Visit: Follow-up Information    Ob/[facility], [place] Follow up in 1 week(s).   Specialty: Obstetrics and  Gynecology Why: BP check Contact information: [address] [place] [address] [phone]           Newborn Data: Live born female  Birth Weight: 6 lb 10.5 oz (3019 g) APGAR: 9, 9  Newborn Delivery   Birth date/time: 06/26/2019 00:54:00 Delivery type: Vaginal, Spontaneous      Baby Feeding: Bottle and Breast Disposition:home with mother   06/27/2019 Ike Bene, CNM

## 2019-06-27 NOTE — Lactation Note (Signed)
This note was copied from a baby's chart. Lactation Consultation Note  Patient Name: Sandy Townsend Date: 06/27/2019 Reason for consult: Follow-up assessment;Term;1st time breastfeeding  P4 mother whose infants is now 80 hours old.  This is a term baby at 39+1 weeks.  Mother did not breast feed her other three children.  Mother had baby latched and feeding in the cradle hold on the left breast when I arrived.  Made a few suggestions as to positioning.  Otherwise, baby had a very nice latch and was rhythmically sucking.  Mother felt the gentle tug but no pain.  Observed baby feeding while visiting with mother.  When baby released mother burped her and placed her deeply onto the other breast.  Again, a good latch was observed.  Mother will continue feeding 8-12 times/24 hours or sooner if baby shows feeding cues.  Mother stated she cluster fed last night.  Engorgement prevention/treatment discussed.  Manual pump with instructions given.  Mother has a DEBP for home use.  Reviewed using EBM and coconut oil for nipple comfort.  Mother has our OP phone number for questions after discharge.  No support person present at this time.  Encouraged mother to call me as needed.  She will be discharged today.   Maternal Data Formula Feeding for Exclusion: No Has patient been taught Hand Expression?: Yes Does the patient have breastfeeding experience prior to this delivery?: No  Feeding Feeding Type: Breast Fed  LATCH Score Latch: Grasps breast easily, tongue down, lips flanged, rhythmical sucking.  Audible Swallowing: Spontaneous and intermittent  Type of Nipple: Everted at rest and after stimulation  Comfort (Breast/Nipple): Soft / non-tender  Hold (Positioning): Assistance needed to correctly position infant at breast and maintain latch.  LATCH Score: 9  Interventions Interventions: Breast feeding basics reviewed;Assisted with latch;Skin to skin;Breast compression;Adjust  position;DEBP;Position options;Support pillows  Lactation Tools Discussed/Used Pump Review: Setup, frequency, and cleaning(Manual pump) Initiated by:: Kainoa Swoboda Date initiated:: 06/27/19   Consult Status Consult Status: Complete Date: 06/27/19 Follow-up type: Call as needed    Jameison Haji R Ryoma Nofziger 06/27/2019, 9:23 AM

## 2019-08-08 DIAGNOSIS — Z3009 Encounter for other general counseling and advice on contraception: Secondary | ICD-10-CM | POA: Diagnosis not present

## 2019-08-08 DIAGNOSIS — N393 Stress incontinence (female) (male): Secondary | ICD-10-CM | POA: Diagnosis not present

## 2019-08-08 DIAGNOSIS — Z113 Encounter for screening for infections with a predominantly sexual mode of transmission: Secondary | ICD-10-CM | POA: Diagnosis not present

## 2019-08-08 DIAGNOSIS — Z124 Encounter for screening for malignant neoplasm of cervix: Secondary | ICD-10-CM | POA: Diagnosis not present

## 2019-08-08 LAB — HM PAP SMEAR: HM Pap smear: NORMAL

## 2019-08-22 DIAGNOSIS — Z30430 Encounter for insertion of intrauterine contraceptive device: Secondary | ICD-10-CM | POA: Diagnosis not present

## 2019-08-22 DIAGNOSIS — N760 Acute vaginitis: Secondary | ICD-10-CM | POA: Diagnosis not present

## 2019-09-01 ENCOUNTER — Other Ambulatory Visit: Payer: Self-pay | Admitting: Obstetrics & Gynecology

## 2019-09-06 NOTE — Progress Notes (Signed)
[id number] [place], [place] - [phone] AT [facility] OF [facility] & [address] [place] [address] Phone: [phone] Fax: [phone]      Your procedure is scheduled on Tuesday 09/13/2019.  Report to [facility] Main Entrance "A" at 07:00 A.M., and check in at the Admitting office.  Call this number if you have problems, questions, concerns:  [phone]    Remember:  Do not eat or drink after midnight the night before your surgery    Take these medicines the morning of surgery with A SIP OF WATER: Bupropion (Wellbutrin XL) Labetalol (Normodyne)  As of today, STOP taking any Aspirin (unless otherwise instructed by your surgeon) and Aspirin containing products, Aleve, Naproxen, Ibuprofen, Motrin, Advil, Goody's, BC's, all herbal medications, fish oil, and all vitamins.                      Do not wear jewelry, make up, or nail polish            Do not wear lotions, powders, perfumes, or deodorant.            Do not shave 48 hours prior to surgery.            Do not bring valuables to the hospital.            [facility] is not responsible for any belongings or valuables.  Do NOT Smoke (Tobacco/Vapping) or drink Alcohol 24 hours prior to your procedure  If you use a CPAP at night, you may bring all equipment for your overnight stay.   Contacts, glasses, dentures or bridgework may not be worn into surgery.      For patients admitted to the hospital, discharge time will be determined by your treatment team.   Patients discharged the day of surgery will not be allowed to drive home, and someone needs to stay with them for 24 hours.    Special instructions:   [place]- Preparing For Surgery  Before surgery, you can play an important role. Because skin is not sterile, your skin needs to be as free of germs as possible. You can reduce the number of germs on your skin by washing with CHG (chlorahexidine gluconate) Soap  before surgery.  CHG is an antiseptic cleaner which kills germs and bonds with the skin to continue killing germs even after washing.    Oral Hygiene is also important to reduce your risk of infection.  Remember - BRUSH YOUR TEETH THE MORNING OF SURGERY WITH YOUR REGULAR TOOTHPASTE  Please do not use if you have an allergy to CHG or antibacterial soaps. If your skin becomes reddened/irritated stop using the CHG.  Do not shave (including legs and underarms) for at least 48 hours prior to first CHG shower. It is OK to shave your face.  Please follow these instructions carefully.   1. Shower the NIGHT BEFORE SURGERY and the MORNING OF SURGERY with CHG Soap.   2. If you chose to wash your hair, wash your hair first as usual with your normal shampoo.  3. After you shampoo, rinse your hair and body thoroughly to remove the shampoo.  4. Use CHG as you would any other liquid soap. You can apply CHG directly to the skin and wash gently with a scrungie or a clean washcloth.   5. Apply the CHG Soap to your body ONLY FROM THE NECK DOWN.  Do not  use on open wounds or open sores. Avoid contact with your eyes, ears, mouth and genitals (private parts). Wash Face and genitals (private parts)  with your normal soap.   6. Wash thoroughly, paying special attention to the area where your surgery will be performed.  7. Thoroughly rinse your body with warm water from the neck down.  8. DO NOT shower/wash with your normal soap after using and rinsing off the CHG Soap.  9. Pat yourself dry with a CLEAN TOWEL.  10. Wear CLEAN PAJAMAS to bed the night before surgery, wear comfortable clothes the morning of surgery  11. Place CLEAN SHEETS on your bed the night of your first shower and DO NOT SLEEP WITH PETS.   Day of Surgery: Shower with CHG soap as directed Do not apply any deodorants/lotions.  Please wear clean clothes to the hospital/surgery center.   Remember to brush your teeth WITH YOUR REGULAR  TOOTHPASTE.   Please read over the following fact sheets that you were given.

## 2019-09-07 ENCOUNTER — Encounter (HOSPITAL_COMMUNITY): Payer: Self-pay

## 2019-09-07 ENCOUNTER — Encounter (HOSPITAL_COMMUNITY)
Admission: RE | Admit: 2019-09-07 | Discharge: 2019-09-07 | Disposition: A | Discharge status: Home / Self Care | Payer: Medicaid Other | Source: Ambulatory Visit | Attending: Obstetrics & Gynecology | Admitting: Obstetrics & Gynecology

## 2019-09-07 ENCOUNTER — Other Ambulatory Visit: Payer: Self-pay

## 2019-09-07 DIAGNOSIS — Z01818 Encounter for other preprocedural examination: Secondary | ICD-10-CM | POA: Insufficient documentation

## 2019-09-07 LAB — BASIC METABOLIC PANEL
Anion gap: 13 (ref 5–15)
BUN: 6 mg/dL (ref 6–20)
CO2: 19 mmol/L — ABNORMAL LOW (ref 22–32)
Calcium: 9 mg/dL (ref 8.9–10.3)
Chloride: 108 mmol/L (ref 98–111)
Creatinine, Ser: 0.81 mg/dL (ref 0.44–1.00)
GFR calc Af Amer: >60 mL/min (ref >60)
GFR calc non Af Amer: >60 mL/min (ref >60)
Glucose, Bld: 84 mg/dL (ref 70–99)
Potassium: 4.3 mmol/L (ref 3.5–5.1)
Sodium: 140 mmol/L (ref 135–145)

## 2019-09-07 LAB — CBC
HCT: 41.9 % (ref 36.0–46.0)
Hemoglobin: 13.9 g/dL (ref 12.0–15.0)
MCH: 30.2 pg (ref 26.0–34.0)
MCHC: 33.2 g/dL (ref 30.0–36.0)
MCV: 91.1 fL (ref 80.0–100.0)
Platelets: 289 10*3/uL (ref 150–400)
RBC: 4.6 MIL/uL (ref 3.87–5.11)
RDW: 12.5 % (ref 11.5–15.5)
WBC: 6.9 10*3/uL (ref 4.0–10.5)
nRBC: 0 % (ref 0.0–0.2)

## 2019-09-07 NOTE — Progress Notes (Signed)
Patient denies shortness of breath, fever, cough or chest pain.  PCP -  Roosvelt Maser , NP Cardiologist - n/a  Chest x-ray - 05/21/19 (1V) EKG - 09/07/19 Stress Test - n/a ECHO - n/a Cardiac Cath - n/a  Coronavirus Screening Covid test scheduled on Sat., 09/10/19. Do you have any of the following symptoms:  Cough yes/no: No Fever (>100.73F)  yes/no: No Runny nose yes/no: No Sore throat yes/no: No Difficulty breathing/shortness of breath  yes/no: No  Have you traveled in the last 14 days and where? yes/no: No  Patient verbalized understanding of instructions that were given to them at the PAT appointment.

## 2019-09-10 ENCOUNTER — Other Ambulatory Visit (HOSPITAL_COMMUNITY): Payer: Medicaid Other

## 2019-09-12 ENCOUNTER — Other Ambulatory Visit (HOSPITAL_COMMUNITY)
Admission: RE | Admit: 2019-09-12 | Discharge: 2019-09-12 | Disposition: A | Discharge status: Home / Self Care | Payer: Medicaid Other | Source: Ambulatory Visit | Attending: Obstetrics & Gynecology | Admitting: Obstetrics & Gynecology

## 2019-09-12 DIAGNOSIS — Z20822 Contact with and (suspected) exposure to covid-19: Secondary | ICD-10-CM | POA: Insufficient documentation

## 2019-09-12 DIAGNOSIS — Z01812 Encounter for preprocedural laboratory examination: Secondary | ICD-10-CM | POA: Diagnosis not present

## 2019-09-12 LAB — SARS CORONAVIRUS 2 (TAT 6-24 HRS): SARS Coronavirus 2: NEGATIVE

## 2019-09-12 NOTE — H&P (Signed)
Sandy Townsend is an 34 y.o. female Para 4 with stress urinary incontinence here for tension free vaginal tape sling placement.  Patient has tried conservative measures of kegels and pelvic floor exercises without success.    Pertinent Gynecological History: Menses: flow is moderate Bleeding: None Contraception: IUD DES exposure: unknown Blood transfusions: none Sexually transmitted diseases: no past history Previous GYN Procedures: None  Last mammogram: N/A Date: N/A Last pap: normal Date: 08/08/19 OB History: G4, P4, 4 vaginal deliveries.    Menstrual History: Patient's last menstrual period was 09/29/2018 (exact date).    Past Medical History:  Diagnosis Date  . Anxiety   . Depression   . Exposure to TB   . Heart murmur    never had any problems, dx as a child  . Hypertension     Past Surgical History:  Procedure Laterality Date  . WISDOM TOOTH EXTRACTION      Family History  Problem Relation Age of Onset  . Diabetes Mother   . Hypercalcemia Mother   . Hypertension Mother   . Hypertension Father   . Diabetes Paternal Grandmother   . Diabetes Paternal Grandfather     Social History:  reports that she has never smoked. She has never used smokeless tobacco. She reports previous alcohol use of about 2.0 standard drinks of alcohol per week. She reports that she does not use drugs.  Allergies:  Allergies  Allergen Reactions  . Iodine Anaphylaxis  . Shellfish Allergy Anaphylaxis and Swelling  . Pollen Extract Itching    No medications prior to admission.    Review of Systems   Constitutional: Denies fevers/chills Cardiovascular: Denies chest pain or palpitations Pulmonary: Denies coughing or wheezing Gastrointestinal: Denies nausea, vomiting or diarrhea Genitourinary: Denies pelvic pain, unusual vaginal bleeding, unusual vaginal discharge, dysuria, urgency or frequency. With leakage of urine with coughing, laughing.  Musculoskeletal: Denies muscle or joint aches  and pain.  Neurology: Denies abnormal sensations such as tingling or numbness.    Last menstrual period 09/29/2018, currently breastfeeding. Physical Exam  Blood pressure (!) 153/97, pulse 75, temperature 98.1 F (36.7 C), temperature source Oral, resp. rate 18, height 5\' 1"  (1.549 m), weight 83.5 kg, last menstrual period 09/29/2018, SpO2 97 %, currently breastfeeding. Constitutional: She is oriented to person, place, and time. She appears well-developed and well-nourished.  HENT:  Head: Normocephalic and atraumatic.  Eyes: Conjunctivae and EOM are normal.  Neck: Normal range of motion. Neck supple.  Cardiovascular: Normal rate, regular rhythm, normal heart sounds and intact distal pulses.  Respiratory: Effort normal and breath sounds normal.  GI: Soft. She exhibits no mass. There is no tenderness.  Genitourinary: Vagina normal and uterus normal. With leakage of urine upon valsalva.  Musculoskeletal: Normal range of motion.  Neurological: She is alert and oriented to person, place, and time.  Skin: Skin is warm and dry.  Psychiatric: She has a normal mood and affect. Judgment normal.  No results found for this or any previous visit (from the past 24 hour(s)).  No results found. CBC    Component Value Date/Time   WBC 6.9 09/07/2019 1155   RBC 4.60 09/07/2019 1155   HGB 13.9 09/07/2019 1155   HGB 14.2 04/09/2018 1532   HCT 41.9 09/07/2019 1155   HCT 41.9 04/09/2018 1532   PLT 289 09/07/2019 1155   PLT 330 04/09/2018 1532   MCV 91.1 09/07/2019 1155   MCV 92 04/09/2018 1532   MCH 30.2 09/07/2019 1155   MCHC 33.2 09/07/2019 1155  RDW 12.5 09/07/2019 1155   RDW 12.0 04/09/2018 1532   LYMPHSABS 1.8 01/22/2019 2315   LYMPHSABS 1.4 04/09/2018 1532   MONOABS 0.6 01/22/2019 2315   EOSABS 0.1 01/22/2019 2315   EOSABS 0.1 04/09/2018 1532   BASOSABS 0.0 01/22/2019 2315   BASOSABS 0.0 04/09/2018 1532   CMP     Component Value Date/Time   NA 140 09/07/2019 1155   NA 139  04/09/2018 1532   K 4.3 09/07/2019 1155   CL 108 09/07/2019 1155   CO2 19 (L) 09/07/2019 1155   GLUCOSE 84 09/07/2019 1155   BUN 6 09/07/2019 1155   BUN 8 04/09/2018 1532   CREATININE 0.81 09/07/2019 1155   CALCIUM 9.0 09/07/2019 1155   PROT 6.3 (L) 06/25/2019 1121   PROT 7.5 04/09/2018 1532   ALBUMIN 2.8 (L) 06/25/2019 1121   ALBUMIN 4.5 04/09/2018 1532   AST 18 06/25/2019 1121   ALT 16 06/25/2019 1121   ALKPHOS 92 06/25/2019 1121   BILITOT 0.7 06/25/2019 1121   BILITOT 0.5 04/09/2018 1532   GFRNONAA >60 09/07/2019 1155   GFRAA >60 09/07/2019 1155   09/13/2019: Urine pregnancy test: negative.   Assessment/Plan: 34 y/o para 4 with stress urinary incontinence here for TVT procedure, and cystoscopy,  - Admit to [place] OR -NPO and IV fluids - Consent obtained for the procedure.  -Cefazolin to be given pre-operatively.  - Anesthesia consult.   Konrad Felix, MD.  09/12/2019, 12:20 AM

## 2019-09-13 ENCOUNTER — Ambulatory Visit (HOSPITAL_COMMUNITY): Payer: Medicaid Other | Admitting: Certified Registered"

## 2019-09-13 ENCOUNTER — Encounter (HOSPITAL_COMMUNITY): Payer: Self-pay | Admitting: Obstetrics & Gynecology

## 2019-09-13 ENCOUNTER — Other Ambulatory Visit: Payer: Self-pay

## 2019-09-13 ENCOUNTER — Encounter (HOSPITAL_COMMUNITY)
Admission: RE | Disposition: A | Discharge status: Home / Self Care | Payer: Self-pay | Source: Home / Self Care | Attending: Obstetrics & Gynecology

## 2019-09-13 ENCOUNTER — Ambulatory Visit (HOSPITAL_COMMUNITY)
Admission: RE | Admit: 2019-09-13 | Discharge: 2019-09-13 | Disposition: A | Discharge status: Home / Self Care | Payer: Medicaid Other | Attending: Obstetrics & Gynecology | Admitting: Obstetrics & Gynecology

## 2019-09-13 DIAGNOSIS — Z91013 Allergy to seafood: Secondary | ICD-10-CM | POA: Insufficient documentation

## 2019-09-13 DIAGNOSIS — F419 Anxiety disorder, unspecified: Secondary | ICD-10-CM | POA: Diagnosis not present

## 2019-09-13 DIAGNOSIS — Z8489 Family history of other specified conditions: Secondary | ICD-10-CM | POA: Insufficient documentation

## 2019-09-13 DIAGNOSIS — Z91041 Radiographic dye allergy status: Secondary | ICD-10-CM | POA: Insufficient documentation

## 2019-09-13 DIAGNOSIS — F339 Major depressive disorder, recurrent, unspecified: Secondary | ICD-10-CM | POA: Diagnosis not present

## 2019-09-13 DIAGNOSIS — Z8249 Family history of ischemic heart disease and other diseases of the circulatory system: Secondary | ICD-10-CM | POA: Insufficient documentation

## 2019-09-13 DIAGNOSIS — I10 Essential (primary) hypertension: Secondary | ICD-10-CM | POA: Diagnosis not present

## 2019-09-13 DIAGNOSIS — F329 Major depressive disorder, single episode, unspecified: Secondary | ICD-10-CM | POA: Diagnosis not present

## 2019-09-13 DIAGNOSIS — J309 Allergic rhinitis, unspecified: Secondary | ICD-10-CM | POA: Diagnosis not present

## 2019-09-13 DIAGNOSIS — N393 Stress incontinence (female) (male): Secondary | ICD-10-CM | POA: Diagnosis not present

## 2019-09-13 DIAGNOSIS — Z9109 Other allergy status, other than to drugs and biological substances: Secondary | ICD-10-CM | POA: Diagnosis not present

## 2019-09-13 DIAGNOSIS — Z833 Family history of diabetes mellitus: Secondary | ICD-10-CM | POA: Diagnosis not present

## 2019-09-13 HISTORY — PX: CYSTOSCOPY: SHX5120

## 2019-09-13 HISTORY — PX: BLADDER SUSPENSION: SHX72

## 2019-09-13 LAB — POCT PREGNANCY, URINE: Preg Test, Ur: NEGATIVE

## 2019-09-13 SURGERY — TRANSVAGINAL TAPE (TVT) PROCEDURE
Anesthesia: General | Site: Vagina

## 2019-09-13 MED ORDER — FENTANYL CITRATE (PF) 250 MCG/5ML IJ SOLN
INTRAMUSCULAR | Status: AC
  Filled 2019-09-13: qty 5

## 2019-09-13 MED ORDER — MIDAZOLAM HCL 2 MG/2ML IJ SOLN
INTRAMUSCULAR | Status: DC | PRN
Start: 2019-09-13 — End: 2019-09-13
  Administered 2019-09-13: 2 mg via INTRAVENOUS
  Administered 2019-09-13 (×2): 1 mg via INTRAVENOUS

## 2019-09-13 MED ORDER — AMISULPRIDE (ANTIEMETIC) 5 MG/2ML IV SOLN
INTRAVENOUS | Status: AC
  Filled 2019-09-13: qty 2

## 2019-09-13 MED ORDER — AMISULPRIDE (ANTIEMETIC) 5 MG/2ML IV SOLN
10.0000 mg | Freq: Once | INTRAVENOUS | Status: AC
  Administered 2019-09-13: 5 mg via INTRAVENOUS

## 2019-09-13 MED ORDER — MIDAZOLAM HCL 2 MG/2ML IJ SOLN
INTRAMUSCULAR | Status: AC
  Filled 2019-09-13: qty 2

## 2019-09-13 MED ORDER — VASOPRESSIN 20 UNIT/ML IV SOLN
INTRAVENOUS | Status: AC
  Filled 2019-09-13: qty 1

## 2019-09-13 MED ORDER — CHLORHEXIDINE GLUCONATE 0.12 % MT SOLN
15.0000 mL | Freq: Once | OROMUCOSAL | Status: AC
  Administered 2019-09-13: 15 mL via OROMUCOSAL
  Filled 2019-09-13: qty 15

## 2019-09-13 MED ORDER — LACTATED RINGERS IV SOLN: INTRAVENOUS | Status: DC

## 2019-09-13 MED ORDER — HYDROMORPHONE HCL 1 MG/ML IJ SOLN
0.2500 mg | INTRAMUSCULAR | Status: DC | PRN
  Administered 2019-09-13 (×2): 0.5 mg via INTRAVENOUS

## 2019-09-13 MED ORDER — ESTRADIOL 0.1 MG/GM VA CREA
TOPICAL_CREAM | VAGINAL | Status: AC
  Filled 2019-09-13: qty 42.5

## 2019-09-13 MED ORDER — HYDRALAZINE HCL 20 MG/ML IJ SOLN
5.0000 mg | Freq: Once | INTRAMUSCULAR | Status: AC
  Administered 2019-09-13: 5 mg via INTRAVENOUS

## 2019-09-13 MED ORDER — PROPOFOL 10 MG/ML IV BOLUS
INTRAVENOUS | Status: AC
  Filled 2019-09-13: qty 20

## 2019-09-13 MED ORDER — HYDRALAZINE HCL 20 MG/ML IJ SOLN
INTRAMUSCULAR | Status: AC
  Filled 2019-09-13: qty 1

## 2019-09-13 MED ORDER — IBUPROFEN 800 MG PO TABS: 800.0000 mg | ORAL_TABLET | Freq: Three times a day (TID) | ORAL | 0 refills | Status: DC | PRN

## 2019-09-13 MED ORDER — LIDOCAINE 2% (20 MG/ML) 5 ML SYRINGE
INTRAMUSCULAR | Status: AC
  Filled 2019-09-13: qty 5

## 2019-09-13 MED ORDER — LIDOCAINE-EPINEPHRINE 1 %-1:100000 IJ SOLN
INTRAMUSCULAR | Status: AC
  Filled 2019-09-13: qty 2

## 2019-09-13 MED ORDER — HYDROMORPHONE HCL 1 MG/ML IJ SOLN
INTRAMUSCULAR | Status: AC
  Filled 2019-09-13: qty 1

## 2019-09-13 MED ORDER — LIDOCAINE-EPINEPHRINE 1 %-1:100000 IJ SOLN
INTRAMUSCULAR | Status: DC | PRN
  Administered 2019-09-13: 30 mL

## 2019-09-13 MED ORDER — ONDANSETRON HCL 4 MG/2ML IJ SOLN
INTRAMUSCULAR | Status: AC
  Filled 2019-09-13: qty 2

## 2019-09-13 MED ORDER — STERILE WATER FOR IRRIGATION IR SOLN
Status: DC | PRN
  Administered 2019-09-13: 1000 mL via INTRAVESICAL

## 2019-09-13 MED ORDER — ORAL CARE MOUTH RINSE: 15.0000 mL | Freq: Once | OROMUCOSAL | Status: AC

## 2019-09-13 MED ORDER — CEFAZOLIN SODIUM-DEXTROSE 2-4 GM/100ML-% IV SOLN
2.0000 g | INTRAVENOUS | Status: AC
  Administered 2019-09-13: 2 g via INTRAVENOUS
  Filled 2019-09-13: qty 100

## 2019-09-13 MED ORDER — ACETAMINOPHEN 500 MG PO TABS: 1000.0000 mg | ORAL_TABLET | Freq: Once | ORAL | Status: AC

## 2019-09-13 MED ORDER — FENTANYL CITRATE (PF) 250 MCG/5ML IJ SOLN
INTRAMUSCULAR | Status: DC | PRN
  Administered 2019-09-13 (×5): 50 ug via INTRAVENOUS

## 2019-09-13 MED ORDER — ACETAMINOPHEN 500 MG PO TABS
ORAL_TABLET | ORAL | Status: AC
  Administered 2019-09-13: 1000 mg via ORAL
  Filled 2019-09-13: qty 2

## 2019-09-13 MED ORDER — SODIUM CHLORIDE (PF) 0.9 % IJ SOLN
INTRAMUSCULAR | Status: AC
  Filled 2019-09-13: qty 50

## 2019-09-13 SURGICAL SUPPLY — 31 items
BLADE SURG 11 STRL SS (BLADE) ×3 IMPLANT
BLADE SURG 15 STRL LF DISP TIS (BLADE) ×2 IMPLANT
BLADE SURG 15 STRL SS (BLADE) ×1
CANISTER SUCT 3000ML PPV (MISCELLANEOUS) ×3 IMPLANT
CATH FOLEY 2WAY SLVR  5CC 18FR (CATHETERS) ×1
CATH FOLEY 2WAY SLVR 5CC 18FR (CATHETERS) ×2 IMPLANT
CATH ROBINSON RED A/P 16FR (CATHETERS) ×3 IMPLANT
COVER WAND RF STERILE (DRAPES) ×3 IMPLANT
DECANTER SPIKE VIAL GLASS SM (MISCELLANEOUS) IMPLANT
DERMABOND ADVANCED (GAUZE/BANDAGES/DRESSINGS) ×1
DERMABOND ADVANCED .7 DNX12 (GAUZE/BANDAGES/DRESSINGS) ×2 IMPLANT
GAUZE PACKING 1 X5 YD ST (GAUZE/BANDAGES/DRESSINGS) IMPLANT
GLOVE BIO SURGEON STRL SZ7.5 (GLOVE) ×3 IMPLANT
GLOVE BIOGEL PI IND STRL 7.0 (GLOVE) ×2 IMPLANT
GLOVE BIOGEL PI IND STRL 7.5 (GLOVE) ×2 IMPLANT
GLOVE BIOGEL PI INDICATOR 7.0 (GLOVE) ×1
GLOVE BIOGEL PI INDICATOR 7.5 (GLOVE) ×1
GOWN STRL REUS W/ TWL LRG LVL3 (GOWN DISPOSABLE) ×4 IMPLANT
GOWN STRL REUS W/TWL LRG LVL3 (GOWN DISPOSABLE) ×2
KIT TURNOVER KIT B (KITS) ×3 IMPLANT
NEEDLE HYPO 22GX1.5 SAFETY (NEEDLE) ×3 IMPLANT
NS IRRIG 1000ML POUR BTL (IV SOLUTION) ×3 IMPLANT
PACK VAGINAL WOMENS (CUSTOM PROCEDURE TRAY) ×3 IMPLANT
SET CYSTO W/LG BORE CLAMP LF (SET/KITS/TRAYS/PACK) ×3 IMPLANT
SLING TRANS VAGINAL TAPE (Sling) ×1 IMPLANT
SLING UTERINE/ABD GYNECARE TVT (Sling) ×2 IMPLANT
SUT VIC AB 2-0 SH 27 (SUTURE) ×1
SUT VIC AB 2-0 SH 27XBRD (SUTURE) ×2 IMPLANT
TOWEL GREEN STERILE FF (TOWEL DISPOSABLE) ×6 IMPLANT
TRAY FOLEY W/BAG SLVR 16FR (SET/KITS/TRAYS/PACK) ×1
TRAY FOLEY W/BAG SLVR 16FR ST (SET/KITS/TRAYS/PACK) ×2 IMPLANT

## 2019-09-13 NOTE — Interval H&P Note (Signed)
History and Physical Interval Note:  09/13/2019 9:03 AM  Sandy Townsend  has presented today for surgery, with the diagnosis of Stress Incontinence.  The various methods of treatment have been discussed with the patient and family. After consideration of risks, benefits and other options for treatment, the patient has consented to  Procedure(s): TRANSVAGINAL TAPE (TVT) PROCEDURE (N/A) CYSTOSCOPY (N/A) as a surgical intervention.  The patient's history has been reviewed, patient examined, no change in status, stable for surgery.  I have reviewed the patient's chart and labs.  Questions were answered to the patient's satisfaction.    Konrad Felix, MD.

## 2019-09-13 NOTE — Transfer of Care (Signed)
Immediate Anesthesia Transfer of Care Note  Patient: Sandy Townsend  Procedure(s) Performed: TRANSVAGINAL TAPE (TVT) PROCEDURE (N/A Vagina ) CYSTOSCOPY (N/A Bladder)  Patient Location: PACU  Anesthesia Type:MAC  Level of Consciousness: awake, alert , oriented and patient cooperative  Airway & Oxygen Therapy: Patient Spontanous Breathing and Patient connected to nasal cannula oxygen  Post-op Assessment: Report given to RN, Post -op Vital signs reviewed and stable and Patient moving all extremities  Post vital signs: Reviewed and stable  Last Vitals:  Vitals Value Taken Time  BP 137/93 09/13/19 1031  Temp 36.2 C 09/13/19 1030  Pulse 75 09/13/19 1033  Resp 13 09/13/19 1033  SpO2 97 % 09/13/19 1033  Vitals shown include unvalidated device data.  Last Pain:  Vitals:   09/13/19 0744  TempSrc: Oral  PainSc: 0-No pain      Patients Stated Pain Goal: 4 (02/33/43 5686)  Complications: No complications documented.

## 2019-09-13 NOTE — Discharge Instructions (Signed)
  Ms. Cherysh , 1. Nothing in vagina x 6 weeks.  2. Expect some vaginal bleeding for next several days, call me if with excessive bleeding requiring you to change a pad every hour.  3.  Expect some abdominal and vaginal cramping, take pain medication as needed and as prescribed.  Call me if pain is intolerable despite medication use. 4.  You may see some bruising near the incisions and on your pubic area, this may look like dark patches that may change color to dark green then yellow before fading off, it may take several weeks to fade off, let me know at the office if you notice this.  4. Please keep your upcoming office appointment as scheduled.  Sincerely, Dr. Sallye Ober.  [phone] [phone]

## 2019-09-13 NOTE — Anesthesia Preprocedure Evaluation (Addendum)
Anesthesia Evaluation  Patient identified by MRN, date of birth, ID band Patient awake    Reviewed: Allergy & Precautions, H&P , NPO status , Patient's Chart, lab work & pertinent test results, reviewed documented beta blocker date and time   Airway Mallampati: II  TM Distance: >3 FB Neck ROM: Full    Dental no notable dental hx. (+) Teeth Intact, Dental Advisory Given   Pulmonary neg pulmonary ROS,    Pulmonary exam normal breath sounds clear to auscultation       Cardiovascular hypertension, Pt. on medications and Pt. on home beta blockers  Rhythm:Regular Rate:Normal     Neuro/Psych Anxiety Depression negative neurological ROS     GI/Hepatic negative GI ROS, Neg liver ROS,   Endo/Other  negative endocrine ROS  Renal/GU negative Renal ROS  negative genitourinary   Musculoskeletal   Abdominal   Peds  Hematology negative hematology ROS (+)   Anesthesia Other Findings   Reproductive/Obstetrics negative OB ROS                             Anesthesia Physical Anesthesia Plan  ASA: II  Anesthesia Plan: MAC   Post-op Pain Management:    Induction: Intravenous  PONV Risk Score and Plan: 4 or greater and Ondansetron, Dexamethasone and Midazolam  Airway Management Planned: Simple Face Mask  Additional Equipment:   Intra-op Plan:   Post-operative Plan:   Informed Consent: I have reviewed the patients History and Physical, chart, labs and discussed the procedure including the risks, benefits and alternatives for the proposed anesthesia with the patient or authorized representative who has indicated his/her understanding and acceptance.     Dental advisory given  Plan Discussed with: CRNA  Anesthesia Plan Comments:        Anesthesia Quick Evaluation

## 2019-09-13 NOTE — Op Note (Signed)
  Patient: Sandy Townsend, MRN [id number].   Date of birth: 1985/11/28  Date of procedure: 09/13/2019  Preop diagnosis: Stress urinary incontinence  Postop diagnosis: Same as above.    Procedures:  1. Tension free vaginal tape with TVT Exact. 2.  Cystoscopy  Surgeon: Dr. Hoover Browns.  Assistant: Dr. Osborn Coho  Anesthesia: Conscious sedation with local anesthesia  Complications: None  Input: per anesthesia  Output: EBL: 75cc    Urine: 400 cc drained after cystoscopy.    Findings: Urinary stress incontinence.    Indications: 34 year old Para 4 with stress urinary incontinence who had failed conservative measures here for a vaginal sling operation.      Procedure: She was taken to the operating room anesthesia was administered without difficulty. Ancef 2 g IV was given preoperatively.  She was placed in the dorsal lithotomy position onYelloFin stirrups and prepped and draped in the usual sterile fashion.  Abdominal incision area marked with pen, 2 cm lateral from the midline and 1 finger breath above pubic symphysis bone bilaterally.  1% lidocaine injected in this area and behind pubic bone extending towards vaginal incision area, 40cc used total.  18 french Foley catheter placed in and balloon inflated. Area below urethra identified about 1cm below urethra and more lidocaine with epinephrine instilled in this area and lateral sides, 10 cc used.  Incision made over suburethral area and metzenbaum scissors used to develop the space between pubocervical fascia and vaginal mucosa on both sides.  Bladder was drained.  Rigid catheter inserted into foley.  Bladder deviated to the left side and trocar introduced through the right vaginal incision, hugging the pubic bone from below and advanced upwards to exit through right suprapubic incision.  Plastic trocar sheath was pulled through the incision and the trocar removed.  Same procedure was done to place the TVT trocar on the left side.   Cystoscopy was performed and bladder and urethra noted to appear normal and without any evidence of injury.  The sling was adjusted with a kelly clamp placed between sling and urethra to ensure tension free placement.  Patient was asked to cough and sling adjusted to ensure minimal leakage of urine with coughing. Cystoscopy was performed again with normal findings noted again. Incisions were closed with 3-0 vicryl, horizontal mattress stitch on vagina and interrupted stitch on suprapubic area.  Foley catheter was placed in.  Dermabond applied on suprapubic incisions.    Patient was cleaned and taken to recovery area in stable condition.   Specimen: None.  Dr. Sallye Ober.  09/13/2019.  1121am.

## 2019-09-13 NOTE — Progress Notes (Signed)
Completed paperwork to send patient's wallet and jewrly to security office in sealed envelope.  Yellow copy of contents given to Buffy at OR desk to be placed on patient's chart.

## 2019-09-13 NOTE — Anesthesia Postprocedure Evaluation (Signed)
Anesthesia Post Note  Patient: Sandy Townsend  Procedure(s) Performed: TRANSVAGINAL TAPE (TVT) PROCEDURE (N/A Vagina ) CYSTOSCOPY (N/A Bladder)     Patient location during evaluation: PACU Anesthesia Type: General Level of consciousness: awake and alert Pain management: pain level controlled Vital Signs Assessment: post-procedure vital signs reviewed and stable Respiratory status: spontaneous breathing, nonlabored ventilation and respiratory function stable Cardiovascular status: stable and blood pressure returned to baseline Postop Assessment: no apparent nausea or vomiting Anesthetic complications: no   No complications documented.  Last Vitals:  Vitals:   09/13/19 1100 09/13/19 1115  BP: 117/82 117/84  Pulse: 77 79  Resp: 13 15  Temp:    SpO2: 95% 96%    Last Pain:  Vitals:   09/13/19 1100  TempSrc:   PainSc: Asleep                 Emmalina Espericueta,W. EDMOND

## 2019-09-14 ENCOUNTER — Encounter (HOSPITAL_COMMUNITY): Payer: Self-pay | Admitting: Obstetrics & Gynecology

## 2019-09-26 DIAGNOSIS — H04123 Dry eye syndrome of bilateral lacrimal glands: Secondary | ICD-10-CM | POA: Diagnosis not present

## 2019-10-20 DIAGNOSIS — I10 Essential (primary) hypertension: Secondary | ICD-10-CM | POA: Diagnosis not present

## 2019-10-20 DIAGNOSIS — Z09 Encounter for follow-up examination after completed treatment for conditions other than malignant neoplasm: Secondary | ICD-10-CM | POA: Diagnosis not present

## 2019-10-20 DIAGNOSIS — Z30431 Encounter for routine checking of intrauterine contraceptive device: Secondary | ICD-10-CM | POA: Diagnosis not present

## 2019-10-20 DIAGNOSIS — N393 Stress incontinence (female) (male): Secondary | ICD-10-CM | POA: Diagnosis not present

## 2019-11-29 ENCOUNTER — Other Ambulatory Visit: Payer: Self-pay

## 2019-11-29 ENCOUNTER — Other Ambulatory Visit: Payer: Medicaid Other

## 2019-11-29 DIAGNOSIS — Z20822 Contact with and (suspected) exposure to covid-19: Secondary | ICD-10-CM

## 2019-11-30 LAB — NOVEL CORONAVIRUS, NAA: SARS-CoV-2, NAA: NOT DETECTED

## 2020-02-15 DIAGNOSIS — Z23 Encounter for immunization: Secondary | ICD-10-CM | POA: Diagnosis not present

## 2020-02-24 DIAGNOSIS — H5213 Myopia, bilateral: Secondary | ICD-10-CM | POA: Diagnosis not present

## 2020-04-25 DIAGNOSIS — Z0000 Encounter for general adult medical examination without abnormal findings: Secondary | ICD-10-CM | POA: Diagnosis not present

## 2020-04-25 DIAGNOSIS — Z0001 Encounter for general adult medical examination with abnormal findings: Secondary | ICD-10-CM | POA: Diagnosis not present

## 2020-04-25 DIAGNOSIS — Z6834 Body mass index (BMI) 34.0-34.9, adult: Secondary | ICD-10-CM | POA: Diagnosis not present

## 2020-04-25 DIAGNOSIS — Z113 Encounter for screening for infections with a predominantly sexual mode of transmission: Secondary | ICD-10-CM | POA: Diagnosis not present

## 2020-04-25 DIAGNOSIS — Z01411 Encounter for gynecological examination (general) (routine) with abnormal findings: Secondary | ICD-10-CM | POA: Diagnosis not present

## 2020-04-25 DIAGNOSIS — Z30431 Encounter for routine checking of intrauterine contraceptive device: Secondary | ICD-10-CM | POA: Diagnosis not present

## 2020-09-17 DIAGNOSIS — Z20822 Contact with and (suspected) exposure to covid-19: Secondary | ICD-10-CM | POA: Diagnosis not present

## 2020-09-17 DIAGNOSIS — I10 Essential (primary) hypertension: Secondary | ICD-10-CM | POA: Diagnosis not present

## 2020-09-17 DIAGNOSIS — Z3202 Encounter for pregnancy test, result negative: Secondary | ICD-10-CM | POA: Diagnosis not present

## 2020-09-17 DIAGNOSIS — R030 Elevated blood-pressure reading, without diagnosis of hypertension: Secondary | ICD-10-CM | POA: Diagnosis not present

## 2020-09-17 DIAGNOSIS — R519 Headache, unspecified: Secondary | ICD-10-CM | POA: Diagnosis not present

## 2020-09-18 ENCOUNTER — Telehealth: Payer: Self-pay

## 2020-09-18 NOTE — Telephone Encounter (Signed)
Transition Care Management Unsuccessful Follow-up Telephone Call  Date of discharge and from where:  09/17/2020 from [facility]  Attempts:  1st Attempt  Reason for unsuccessful TCM follow-up call:  Missing or invalid number

## 2020-09-20 NOTE — Telephone Encounter (Signed)
Transition Care Management Unsuccessful Follow-up Telephone Call  Date of discharge and from where:  09/17/2020 from [facility]  Attempts:  2nd Attempt  Reason for unsuccessful TCM follow-up call:  Missing or invalid number

## 2020-09-21 NOTE — Telephone Encounter (Signed)
Transition Care Management Unsuccessful Follow-up Telephone Call  Date of discharge and from where:  09/17/2020 from [facility]  Attempts:  3rd Attempt  Reason for unsuccessful TCM follow-up call:  Unable to reach patient

## 2020-10-31 DIAGNOSIS — Z23 Encounter for immunization: Secondary | ICD-10-CM | POA: Diagnosis not present

## 2020-12-04 DIAGNOSIS — Z23 Encounter for immunization: Secondary | ICD-10-CM | POA: Diagnosis not present

## 2020-12-27 ENCOUNTER — Telehealth: Payer: Medicaid Other | Admitting: Family Medicine

## 2020-12-27 DIAGNOSIS — R059 Cough, unspecified: Secondary | ICD-10-CM | POA: Diagnosis not present

## 2020-12-27 DIAGNOSIS — R0981 Nasal congestion: Secondary | ICD-10-CM

## 2020-12-27 MED ORDER — PROMETHAZINE-DM 6.25-15 MG/5ML PO SYRP: 2.5000 mL | ORAL_SOLUTION | Freq: Four times a day (QID) | ORAL | 0 refills | Status: DC | PRN

## 2020-12-27 MED ORDER — FLUTICASONE PROPIONATE 50 MCG/ACT NA SUSP: 2.0000 | Freq: Every day | NASAL | 0 refills | Status: DC

## 2020-12-27 NOTE — Progress Notes (Signed)
Sandy Townsend, Sandy Townsend are scheduled for a virtual visit with your provider today.    Just as we do with appointments in the office, we must obtain your consent to participate.  Your consent will be active for this visit and any virtual visit you may have with one of our providers in the next 365 days.    If you have a MyChart account, I can also send a copy of this consent to you electronically.  All virtual visits are billed to your insurance company just like a traditional visit in the office.  As this is a virtual visit, video technology does not allow for your provider to perform a traditional examination.  This may limit your provider's ability to fully assess your condition.  If your provider identifies any concerns that need to be evaluated in person or the need to arrange testing such as labs, EKG, etc, we will make arrangements to do so.    Although advances in technology are sophisticated, we cannot ensure that it will always work on either your end or our end.  If the connection with a video visit is poor, we may have to switch to a telephone visit.  With either a video or telephone visit, we are not always able to ensure that we have a secure connection.   I need to obtain your verbal consent now.   Are you willing to proceed with your visit today?   Veta Dambrosia has provided verbal consent on 12/27/2020 for a virtual visit (video or telephone).   Freddy Finner, NP 12/27/2020  12:14 PM   Date:  12/27/2020   ID:  Liana Crocker, DOB [birth year], MRN [id number]  Patient Location: Home Provider Location: Home Office   Participants: Patient and Provider for Visit and Wrap up  Method of visit: Video  Location of Patient: Home Location of Provider: Home Office Consent was obtain for visit over the video. Services rendered by provider: Visit was performed via video  A video enabled telemedicine application was used and I verified that I am speaking with the correct person using two  identifiers.  PCP:  Larae Grooms, NP   Chief Complaint:  sinus symptoms   History of Present Illness:    Sandy Townsend is a 35 y.o. female with history as stated below. Presents video telehealth for an acute care visit sinus symptoms. Thought I was allergy related at first. Daughter jut started daycare and so she came home last week with virus- but is starting to feel better again. However, now she is starting to feel worse and thinks she might have the same thing her daughter did have.   Onset of symptoms was on 12/24/20 and symptoms have been persistent and include:     Congestions-drainage- green, stuffy, inflamed, feels like achy, itchy throat, headache.  Denies having shortness of breath, chest pain, ear pain, or exposure to covid or other sick contacts. Modifying factors include: nothing at this time No other aggravating or relieving factors.  No other c/o.  Past Medical, Surgical, Social History, Allergies, and Medications have been Reviewed.  Past Medical History:  Diagnosis Date   Anxiety    Depression    Exposure to TB    Heart murmur    never had any problems, dx as a child   Hypertension     No outpatient medications have been marked as taking for the 12/27/20 encounter (Appointment) with [facility] PROVIDER.     Allergies:   Iodine, Shellfish allergy, and Pollen extract  ROS See HPI for history of present illness.  Physical Exam Constitutional:      Appearance: Normal appearance.  HENT:     Head: Normocephalic and atraumatic.     Right Ear: External ear normal.     Left Ear: External ear normal.     Nose: Nose normal.  Eyes:     Conjunctiva/sclera: Conjunctivae normal.  Pulmonary:     Effort: Pulmonary effort is normal.     Comments: No cough or shortness of breath noted in conversation   Musculoskeletal:        General: Normal range of motion.  Skin:    Coloration: Skin is not jaundiced.  Neurological:     Mental Status: She is alert and  oriented to person, place, and time.  Psychiatric:        Mood and Affect: Mood normal.        Behavior: Behavior normal.              A&P  1. Cough Questionable cause of symptoms, daughter being in daycare RVS vs COVID vs other virus.  COVID testing recommended  Cough syrup and OTC treatment discussed  If + she would like antiviral advised to upload a photo and let us know  Is aware she is not to be pregnant or conceive in the next 3 months with antiviral use   Reviewed side effects, risks and benefits of medication.   Patient acknowledged agreement and understanding of the plan.    - promethazine-dextromethorphan (PROMETHAZINE-DM) 6.25-15 MG/5ML syrup; Take 2.5 mLs by mouth 4 (four) times daily as needed for cough.  Dispense: 118 mL; Refill: 0  2. Nasal sinus congestion   - fluticasone (FLONASE) 50 MCG/ACT nasal spray; Place 2 sprays into both nostrils daily.  Dispense: 16 g; Refill: 0  I discussed the assessment and treatment plan with the patient. The patient was provided an opportunity to ask questions and all were answered. The patient agreed with the plan and demonstrated an understanding of the instructions.   The patient was advised to call back or seek an in-person evaluation if the symptoms worsen or if the condition fails to improve as anticipated.   The above assessment and management plan was discussed with the patient. The patient verbalized understanding of and has agreed to the management plan. Patient is aware to call the clinic if symptoms persist or worsen. Patient is aware when to return to the clinic for a follow-up visit. Patient educated on when it is appropriate to go to the emergency department.   Time:   Today, I have spent 10 minutes with the patient with telehealth technology discussing the above problems, reviewing the chart, previous notes, medications and orders.   Medication Changes: No orders of the defined types were placed in this  encounter.    Disposition:  Follow up PRN Signed, Freddy Finner, NP  12/27/2020 12:14 PM

## 2020-12-27 NOTE — Patient Instructions (Addendum)
I appreciate the opportunity to provide you with care for your health and wellness.  Please take COVID test and upload results.  Please continue to practice social distancing to keep you, your family, and our community safe.  If you must go out, please wear a mask and practice good handwashing..  Have a wonderful day. With Gratitude, Tereasa Coop, DNP, AGNP-BC

## 2021-03-13 DIAGNOSIS — R059 Cough, unspecified: Secondary | ICD-10-CM | POA: Diagnosis not present

## 2021-03-13 DIAGNOSIS — R030 Elevated blood-pressure reading, without diagnosis of hypertension: Secondary | ICD-10-CM | POA: Diagnosis not present

## 2021-03-13 DIAGNOSIS — U071 COVID-19: Secondary | ICD-10-CM | POA: Diagnosis not present

## 2021-08-09 NOTE — Progress Notes (Signed)
? ?New Patient Office Visit ? ?Subjective   ? ?Patient ID: Sandy Townsend, female    DOB: [birth year]  Age: 36 y.o. MRN: [id number] ? ?CC:  ?Chief Complaint  ?Patient presents with  ? Establish Care  ?  Np. Est care. Elevated BP and depression  ? ? ?HPI ?Earma Reading presents for new patient visit to establish care.  Introduced to Designer, jewellery role and practice setting.  All questions answered.  Discussed provider/patient relationship and expectations. ? ?She has a history of anxiety and depression. She was on medication for this, however stopped medication while pregnant. She states that she was taking wellbutrin but feels like it made her anxiety worse. She has been having increasing in worrying and trouble getting things done other than caring for her kids. She works full time and is going to school full time. She has a history of panic attacks and has been having some recently again with chest tightness and shortness of breath and heart racing.  ? ?She has a history of high blood pressure. She was taking blood pressure medication, however she has been out of it for a while. She has a blood pressure monitor at home, however is not checking her blood pressures. She denies chest pain and shortness of breath, except for when having a panic attack.  ? ? ?  08/12/2021  ?  2:44 PM 12/09/2018  ? 11:17 AM 08/02/2018  ?  8:18 AM 04/09/2018  ?  1:46 PM 01/27/2018  ?  2:00 PM  ?Depression screen PHQ 2/9  ?Decreased Interest 3 0 0 1 0  ?Down, Depressed, Hopeless 3 1 1 1  0  ?PHQ - 2 Score 6 1 1 2  0  ?Altered sleeping 3 1 1 1 1   ?Tired, decreased energy 3 1 1 1 1   ?Change in appetite 2 1 0 0 1  ?Feeling bad or failure about yourself  3 0 0 1 0  ?Trouble concentrating 3 0 0 0 0  ?Moving slowly or fidgety/restless 1 0 0 1 1  ?Suicidal thoughts 0 0 0 0 0  ?PHQ-9 Score 21 4 3 6 4   ?Difficult doing work/chores   Not difficult at all    ? ? ?  08/12/2021  ?  2:44 PM 08/02/2018  ?  8:20 AM 01/27/2018  ?  2:00 PM 12/30/2017  ?  2:57 PM  ?GAD  7 : Generalized Anxiety Score  ?Nervous, Anxious, on Edge 2 0 0 1  ?Control/stop worrying 3 0 0 1  ?Worry too much - different things 3 0 1 0  ?Trouble relaxing 3 1 1 2   ?Restless 1 3 1 2   ?Easily annoyed or irritable 2 2 0 3  ?Afraid - awful might happen 0 1 0 1  ?Total GAD 7 Score 14 7 3 10   ?Anxiety Difficulty  Not difficult at all  Somewhat difficult  ? ? ?Outpatient Encounter Medications as of 08/12/2021  ?Medication Sig  ? Multiple Vitamins-Calcium (ONE-A-DAY WITHIN PO) Take by mouth.  ? NIFEdipine (PROCARDIA-XL/NIFEDICAL-XL) 30 MG 24 hr tablet Take 1 tablet (30 mg total) by mouth daily.  ? sertraline (ZOLOFT) 50 MG tablet Take 1 tablet (50 mg total) by mouth daily.  ? fluticasone (FLONASE) 50 MCG/ACT nasal spray Place 2 sprays into both nostrils daily. (Patient not taking: Reported on 08/12/2021)  ? levonorgestrel (LILETTA, 52 MG,) 19.5 MCG/DAY IUD IUD 1 each by Intrauterine route once.  ? [DISCONTINUED] buPROPion (WELLBUTRIN XL) 150 MG 24 hr tablet Take 150 mg  by mouth daily.  ? [DISCONTINUED] ibuprofen (ADVIL) 800 MG tablet Take 1 tablet (800 mg total) by mouth every 8 (eight) hours as needed for moderate pain or cramping.  ? [DISCONTINUED] labetalol (NORMODYNE) 100 MG tablet Take 1 tablet (100 mg total) by mouth 2 (two) times daily.  ? [DISCONTINUED] Prenatal Vit-Fe Fumarate-FA (PRENATAL MULTIVITAMIN) TABS tablet Take 1 tablet by mouth daily at 12 noon.  ? [DISCONTINUED] promethazine-dextromethorphan (PROMETHAZINE-DM) 6.25-15 MG/5ML syrup Take 2.5 mLs by mouth 4 (four) times daily as needed for cough.  ? ?No facility-administered encounter medications on file as of 08/12/2021.  ? ? ?Past Medical History:  ?Diagnosis Date  ? Anxiety   ? Depression   ? Exposure to TB   ? Heart murmur   ? never had any problems, dx as a child  ? Hypertension   ? ? ?Past Surgical History:  ?Procedure Laterality Date  ? BLADDER SUSPENSION N/A 09/13/2019  ? Procedure: TRANSVAGINAL TAPE (TVT) PROCEDURE;  Surgeon: Waymon Amato, MD;   Location: [facility];  Service: Gynecology;  Laterality: N/A;  ? CYSTOSCOPY N/A 09/13/2019  ? Procedure: CYSTOSCOPY;  Surgeon: Waymon Amato, MD;  Location: [facility];  Service: Gynecology;  Laterality: N/A;  ? WISDOM TOOTH EXTRACTION    ? ? ?Family History  ?Problem Relation Age of Onset  ? Diabetes Mother   ? Hypercalcemia Mother   ? Hypertension Mother   ? Hypertension Father   ? Diabetes Paternal Grandmother   ? Diabetes Paternal Grandfather   ? ? ?Social History  ? ?Socioeconomic History  ? Marital status: Single  ?  Spouse name: Not on file  ? Number of children: Not on file  ? Years of education: Not on file  ? Highest education level: Not on file  ?Occupational History  ? Not on file  ?Tobacco Use  ? Smoking status: Never  ? Smokeless tobacco: Never  ?Vaping Use  ? Vaping Use: Never used  ?Substance and Sexual Activity  ? Alcohol use: Yes  ?  Alcohol/week: 2.0 standard drinks  ?  Types: 2 Glasses of wine per week  ?  Comment: occassionally  ? Drug use: Never  ? Sexual activity: Yes  ?  Birth control/protection: I.U.D.  ?  Comment: Lellita IUD inserted 07/2019  ?Other Topics Concern  ? Not on file  ?Social History Narrative  ? Not on file  ? ?Social Determinants of Health  ? ?Financial Resource Strain: Not on file  ?Food Insecurity: Not on file  ?Transportation Needs: Not on file  ?Physical Activity: Not on file  ?Stress: Not on file  ?Social Connections: Not on file  ?Intimate Partner Violence: Not on file  ? ? ?Review of Systems  ?Constitutional:  Positive for malaise/fatigue. Negative for fever.  ?HENT: Negative.    ?Eyes: Negative.   ?Respiratory: Negative.    ?Cardiovascular: Negative.   ?Gastrointestinal: Negative.   ?Genitourinary: Negative.   ?Musculoskeletal: Negative.   ?Skin: Negative.   ?Neurological:  Positive for headaches. Negative for dizziness.  ?Endo/Heme/Allergies:  Positive for environmental allergies.  ?Psychiatric/Behavioral:  Positive for depression. The patient is nervous/anxious. The patient does  not have insomnia.   ? ?  ?Objective   ? ?BP (!) 142/100 (BP Location: Left Arm, Cuff Size: Normal)   Pulse 86   Temp 97.7 ?F (36.5 ?C) (Temporal)   Resp 18   Ht 5' 2.5" (1.588 m)   Wt 177 lb 6.4 oz (80.5 kg)   Breastfeeding No   BMI 31.93 kg/m?  ? ?  Physical Exam ?Vitals and nursing note reviewed.  ?Constitutional:   ?   General: She is not in acute distress. ?   Appearance: Normal appearance.  ?HENT:  ?   Head: Normocephalic and atraumatic.  ?   Right Ear: Tympanic membrane, ear canal and external ear normal.  ?   Left Ear: Tympanic membrane, ear canal and external ear normal.  ?   Nose: Nose normal.  ?   Mouth/Throat:  ?   Mouth: Mucous membranes are moist.  ?   Pharynx: Oropharynx is clear.  ?Eyes:  ?   Conjunctiva/sclera: Conjunctivae normal.  ?Cardiovascular:  ?   Rate and Rhythm: Normal rate and regular rhythm.  ?   Pulses: Normal pulses.  ?   Heart sounds: Normal heart sounds.  ?Pulmonary:  ?   Effort: Pulmonary effort is normal.  ?   Breath sounds: Normal breath sounds.  ?Abdominal:  ?   Palpations: Abdomen is soft.  ?   Tenderness: There is no abdominal tenderness.  ?Musculoskeletal:     ?   General: Normal range of motion.  ?   Cervical back: Normal range of motion and neck supple. No tenderness.  ?   Right lower leg: No edema.  ?   Left lower leg: No edema.  ?Lymphadenopathy:  ?   Cervical: No cervical adenopathy.  ?Skin: ?   General: Skin is warm and dry.  ?Neurological:  ?   General: No focal deficit present.  ?   Mental Status: She is alert and oriented to person, place, and time.  ?Psychiatric:     ?   Mood and Affect: Mood normal.     ?   Behavior: Behavior normal.     ?   Thought Content: Thought content normal.     ?   Judgment: Judgment normal.  ? ? ? ?  ? ?Assessment & Plan:  ? ?Problem List Items Addressed This Visit   ? ?  ? Cardiovascular and Mediastinum  ? Essential hypertension - Primary  ?  Chronic, not controlled. BP today is 142/100. Will start nifedipine 30mg  daily. Encouraged her  to limit her salt and to start checking her blood pressure a few times a week in the morning. Follow up in 4-6 weeks. Will check labs next visit.  ? ?  ?  ? Relevant Medications  ? NIFEdipine (PROCARDIA-XL/NIF

## 2021-08-12 ENCOUNTER — Encounter: Payer: Self-pay | Admitting: Nurse Practitioner

## 2021-08-12 ENCOUNTER — Ambulatory Visit (INDEPENDENT_AMBULATORY_CARE_PROVIDER_SITE_OTHER): Payer: Medicaid Other | Admitting: Nurse Practitioner

## 2021-08-12 VITALS — BP 142/100 | HR 86 | Temp 97.7°F | Resp 18 | Ht 62.5 in | Wt 177.4 lb

## 2021-08-12 DIAGNOSIS — I10 Essential (primary) hypertension: Secondary | ICD-10-CM | POA: Diagnosis not present

## 2021-08-12 DIAGNOSIS — F419 Anxiety disorder, unspecified: Secondary | ICD-10-CM | POA: Diagnosis not present

## 2021-08-12 DIAGNOSIS — F331 Major depressive disorder, recurrent, moderate: Secondary | ICD-10-CM | POA: Diagnosis not present

## 2021-08-12 MED ORDER — NIFEDIPINE ER OSMOTIC RELEASE 30 MG PO TB24: 30.0000 mg | ORAL_TABLET | Freq: Every day | ORAL | 1 refills | Status: DC

## 2021-08-12 MED ORDER — SERTRALINE HCL 50 MG PO TABS: 50.0000 mg | ORAL_TABLET | Freq: Every day | ORAL | 1 refills | Status: DC

## 2021-08-12 NOTE — Assessment & Plan Note (Addendum)
Chronic, not controlled. She was on medication in the past, however stopped during her pregnancy 2 years ago. She was taking bupropion however she felt like this made her anxiety worse. She denies SI/HI. PHQ 9 is a 21 and GAD 7 is a 14. Will start sertraline 50mg  daily. Follow-up in 4-6 weeks.  ?

## 2021-08-12 NOTE — Patient Instructions (Signed)
It was great to see you! ? ?Start nifedipine 30mg  and zoloft 50mg  once a day.  ? ?Start checking your blood pressure at least 3-4 times a week.  ? ?Let's follow-up in 4-6 weeks, sooner if you have concerns. ? ?If a referral was placed today, you will be contacted for an appointment. Please note that routine referrals can sometimes take up to 3-4 weeks to process. Please call our office if you haven't heard anything after this time frame. ? ?Take care, ? ?Vance Peper, NP ? ?

## 2021-08-12 NOTE — Assessment & Plan Note (Signed)
Chronic, not controlled. She was on medication in the past, however stopped during her pregnancy 2 years ago. She was taking bupropion however she felt like this made her anxiety worse. She denies SI/HI. PHQ 9 is a 21 and GAD 7 is a 14. Will start sertraline 50mg  daily. Also given information for tips on managing anxiety. Follow-up in 4-6 weeks.  ?

## 2021-08-12 NOTE — Assessment & Plan Note (Signed)
Chronic, not controlled. BP today is 142/100. Will start nifedipine 30mg  daily. Encouraged her to limit her salt and to start checking her blood pressure a few times a week in the morning. Follow up in 4-6 weeks. Will check labs next visit.  ?

## 2021-09-06 NOTE — Progress Notes (Unsigned)
   Established Patient Office Visit  Subjective   Patient ID: Sandy Townsend, female    DOB: 05-08-1985  Age: 36 y.o. MRN: [id number]  No chief complaint on file.   HPI  Sandy Townsend is here today to follow-up on hypertension, anxiety, and depression.   Last visit she was started on nifedipine 30mg  daily.   Last visit she was started on sertraline 50mg  daily for her anxiety and depression.   {History (Optional):23778}  ROS    Objective:     There were no vitals taken for this visit. {Vitals History (Optional):23777}  Physical Exam   No results found for any visits on 09/09/21.  {Labs (Optional):23779}  The ASCVD Risk score (Arnett DK, et al., 2019) failed to calculate for the following reasons:   The 2019 ASCVD risk score is only valid for ages 27 to 50    Assessment & Plan:   Problem List Items Addressed This Visit   None   No follow-ups on file.    41, NP

## 2021-09-09 ENCOUNTER — Ambulatory Visit (INDEPENDENT_AMBULATORY_CARE_PROVIDER_SITE_OTHER): Payer: Medicaid Other | Admitting: Nurse Practitioner

## 2021-09-09 ENCOUNTER — Other Ambulatory Visit (HOSPITAL_COMMUNITY)
Admission: RE | Admit: 2021-09-09 | Discharge: 2021-09-09 | Disposition: A | Discharge status: Home / Self Care | Payer: Medicaid Other | Source: Ambulatory Visit | Attending: Nurse Practitioner | Admitting: Nurse Practitioner

## 2021-09-09 ENCOUNTER — Encounter: Payer: Self-pay | Admitting: Nurse Practitioner

## 2021-09-09 VITALS — BP 140/98 | HR 82 | Temp 97.8°F | Wt 171.0 lb

## 2021-09-09 DIAGNOSIS — F419 Anxiety disorder, unspecified: Secondary | ICD-10-CM | POA: Diagnosis not present

## 2021-09-09 DIAGNOSIS — Z1159 Encounter for screening for other viral diseases: Secondary | ICD-10-CM

## 2021-09-09 DIAGNOSIS — I10 Essential (primary) hypertension: Secondary | ICD-10-CM | POA: Diagnosis not present

## 2021-09-09 DIAGNOSIS — Z13220 Encounter for screening for lipoid disorders: Secondary | ICD-10-CM | POA: Diagnosis not present

## 2021-09-09 DIAGNOSIS — F331 Major depressive disorder, recurrent, moderate: Secondary | ICD-10-CM

## 2021-09-09 DIAGNOSIS — Z113 Encounter for screening for infections with a predominantly sexual mode of transmission: Secondary | ICD-10-CM | POA: Diagnosis not present

## 2021-09-09 LAB — CBC WITH DIFFERENTIAL/PLATELET
Basophils Absolute: 0 10*3/uL (ref 0.0–0.1)
Basophils Relative: 0.4 % (ref 0.0–3.0)
Eosinophils Absolute: 0.1 10*3/uL (ref 0.0–0.7)
Eosinophils Relative: 1 % (ref 0.0–5.0)
HCT: 43.4 % (ref 36.0–46.0)
Hemoglobin: 14.9 g/dL (ref 12.0–15.0)
Lymphocytes Relative: 19.7 % (ref 12.0–46.0)
Lymphs Abs: 1.4 10*3/uL (ref 0.7–4.0)
MCHC: 34.3 g/dL (ref 30.0–36.0)
MCV: 91.8 fl (ref 78.0–100.0)
Monocytes Absolute: 0.5 10*3/uL (ref 0.1–1.0)
Monocytes Relative: 7.3 % (ref 3.0–12.0)
Neutro Abs: 5.1 10*3/uL (ref 1.4–7.7)
Neutrophils Relative %: 71.6 % (ref 43.0–77.0)
Platelets: 282 10*3/uL (ref 150.0–400.0)
RBC: 4.73 Mil/uL (ref 3.87–5.11)
RDW: 12.6 % (ref 11.5–15.5)
WBC: 7.1 10*3/uL (ref 4.0–10.5)

## 2021-09-09 LAB — LIPID PANEL
Cholesterol: 166 mg/dL (ref 0–200)
HDL: 40.9 mg/dL (ref >39.00)
LDL Cholesterol: 112 mg/dL — ABNORMAL HIGH (ref 0–99)
NonHDL: 125
Total CHOL/HDL Ratio: 4
Triglycerides: 63 mg/dL (ref 0.0–149.0)
VLDL: 12.6 mg/dL (ref 0.0–40.0)

## 2021-09-09 LAB — COMPREHENSIVE METABOLIC PANEL
ALT: 29 U/L (ref 0–35)
AST: 21 U/L (ref 0–37)
Albumin: 4.6 g/dL (ref 3.5–5.2)
Alkaline Phosphatase: 49 U/L (ref 39–117)
BUN: 8 mg/dL (ref 6–23)
CO2: 23 mEq/L (ref 19–32)
Calcium: 9.1 mg/dL (ref 8.4–10.5)
Chloride: 103 mEq/L (ref 96–112)
Creatinine, Ser: 0.78 mg/dL (ref 0.40–1.20)
GFR: 99.61 mL/min (ref >60.00)
Glucose, Bld: 97 mg/dL (ref 70–99)
Potassium: 3.5 mEq/L (ref 3.5–5.1)
Sodium: 136 mEq/L (ref 135–145)
Total Bilirubin: 0.9 mg/dL (ref 0.2–1.2)
Total Protein: 8 g/dL (ref 6.0–8.3)

## 2021-09-09 LAB — TSH: TSH: 1.4 u[IU]/mL (ref 0.35–5.50)

## 2021-09-09 MED ORDER — NIFEDIPINE ER OSMOTIC RELEASE 60 MG PO TB24: 60.0000 mg | ORAL_TABLET | Freq: Every day | ORAL | 1 refills | Status: DC

## 2021-09-09 NOTE — Assessment & Plan Note (Signed)
Chronic, improving.  Her PHQ 9 went from a 21 to a 10.  She states that overall her symptoms are doing a lot better and she is able to cope on the 50 mg of sertraline daily.  We will continue this regimen.  Follow-up in 6 months or sooner with concerns.

## 2021-09-09 NOTE — Assessment & Plan Note (Signed)
Chronic, not controlled.  BP today 140/98.  We will increase her nifedipine to 60 mg daily.  Encouraged her to check her blood pressure and write it down at home.  Check CMP, CBC.  Follow-up in 4 weeks.

## 2021-09-09 NOTE — Assessment & Plan Note (Signed)
Chronic, improved.  She was started on sertraline 50 mg daily and her GAD-7 went from a 14 to a 4.  She states that her symptoms are overall a lot better and she is able to cope.  Continue sertraline 50 mg daily.

## 2021-09-09 NOTE — Patient Instructions (Addendum)
It was great to see you!  Continue your zoloft 1 tablet daily.  Increase your nifedpine to 60mg  daily. Take 2 capsules of your current bottle, then when you pick up the new one, go back to taking 1 capsule daily. Start writing down your blood pressure at home.   We are checking your labs today and will let you know the results via mychart/phone.   Let's follow-up in 4 weeks, sooner if you have concerns.  If a referral was placed today, you will be contacted for an appointment. Please note that routine referrals can sometimes take up to 3-4 weeks to process. Please call our office if you haven't heard anything after this time frame.  Take care,  , NP

## 2021-09-10 LAB — HSV(HERPES SIMPLEX VRS) I + II AB-IGG
HAV 1 IGG,TYPE SPECIFIC AB: 43.1 index — ABNORMAL HIGH
HSV 2 IGG,TYPE SPECIFIC AB: 15.7 index — ABNORMAL HIGH

## 2021-09-10 LAB — URINE CYTOLOGY ANCILLARY ONLY
Chlamydia: NEGATIVE
Comment: NEGATIVE
Comment: NORMAL
Neisseria Gonorrhea: NEGATIVE

## 2021-09-10 LAB — HEPATITIS C ANTIBODY
Hepatitis C Ab: NONREACTIVE
SIGNAL TO CUT-OFF: 0.12 (ref <1.00)

## 2021-09-10 LAB — RPR: RPR Ser Ql: NONREACTIVE

## 2021-09-10 LAB — HIV ANTIBODY (ROUTINE TESTING W REFLEX): HIV 1&2 Ab, 4th Generation: NONREACTIVE

## 2021-09-11 ENCOUNTER — Telehealth: Payer: Self-pay | Admitting: Nurse Practitioner

## 2021-09-11 DIAGNOSIS — Z111 Encounter for screening for respiratory tuberculosis: Secondary | ICD-10-CM

## 2021-09-11 NOTE — Telephone Encounter (Signed)
Pt has dropped off paperwork (Staff Health Assessment) to be filled out by Lauren. Please call pt at [phone] when completed and she will come by and pick up. I have placed form in Lauren's folder upfront.

## 2021-09-11 NOTE — Telephone Encounter (Signed)
Called and scheduled pt lab appt for TB test. Confirmed w/ pt paperwork received and will be reviewed by her provider. Explain to pt to allow 3-5 bus days for completion of paperwork. Pt voiced understanding

## 2021-09-11 NOTE — Telephone Encounter (Signed)
Pt called and stated if she can get a TB blood test

## 2021-09-13 ENCOUNTER — Other Ambulatory Visit (INDEPENDENT_AMBULATORY_CARE_PROVIDER_SITE_OTHER): Payer: Medicaid Other

## 2021-09-13 ENCOUNTER — Telehealth: Payer: Self-pay

## 2021-09-13 DIAGNOSIS — Z111 Encounter for screening for respiratory tuberculosis: Secondary | ICD-10-CM | POA: Diagnosis not present

## 2021-09-13 NOTE — Telephone Encounter (Signed)
Called and ldvm. Pt can pick up form at front office. Sw, cma
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
## 2021-09-20 LAB — QUANTIFERON-TB GOLD PLUS
Mitogen-NIL: >10 IU/mL
NIL: 0.05 IU/mL
QuantiFERON-TB Gold Plus: NEGATIVE
TB1-NIL: 0 IU/mL
TB2-NIL: 0 IU/mL

## 2021-10-08 ENCOUNTER — Ambulatory Visit (INDEPENDENT_AMBULATORY_CARE_PROVIDER_SITE_OTHER): Payer: Medicaid Other | Admitting: Nurse Practitioner

## 2021-10-08 ENCOUNTER — Encounter: Payer: Self-pay | Admitting: Nurse Practitioner

## 2021-10-08 VITALS — BP 128/84 | HR 84 | Temp 96.9°F | Wt 172.0 lb

## 2021-10-08 DIAGNOSIS — B009 Herpesviral infection, unspecified: Secondary | ICD-10-CM | POA: Diagnosis not present

## 2021-10-08 DIAGNOSIS — I10 Essential (primary) hypertension: Secondary | ICD-10-CM | POA: Diagnosis not present

## 2021-10-08 MED ORDER — SERTRALINE HCL 50 MG PO TABS: 50.0000 mg | ORAL_TABLET | Freq: Every day | ORAL | 1 refills | Status: DC

## 2021-10-08 MED ORDER — VALACYCLOVIR HCL 1 G PO TABS: 1000.0000 mg | ORAL_TABLET | Freq: Two times a day (BID) | ORAL | 0 refills | Status: DC | PRN

## 2021-10-08 MED ORDER — NIFEDIPINE ER OSMOTIC RELEASE 60 MG PO TB24: 60.0000 mg | ORAL_TABLET | Freq: Every day | ORAL | 1 refills | Status: DC

## 2021-10-08 NOTE — Patient Instructions (Signed)
It was great to see you!  Keep taking your blood pressure medication.  Let's follow-up in 6 months, sooner if you have concerns.  If a referral was placed today, you will be contacted for an appointment. Please note that routine referrals can sometimes take up to 3-4 weeks to process. Please call our office if you haven't heard anything after this time frame.  Take care,  Rodman Pickle, NP

## 2021-10-08 NOTE — Assessment & Plan Note (Signed)
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  She states that she had some blisters a few weeks ago on her buttock that have resolved, she was unsure if this was a potential outbreak.  Discussed that if she does have any questions or concerns, she can schedule an appointment and be evaluated.  We will also give her Valtrex 1 g twice a day for 7 days as needed for outbreaks.

## 2021-10-08 NOTE — Assessment & Plan Note (Signed)
Chronic, stable.  Blood pressure has improved and is 128/84 today.  We will have her continue nifedipine 60 mg daily, refill sent to the pharmacy.  Continue checking blood pressure at home.  Follow-up in 6 months.
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
## 2022-04-10 ENCOUNTER — Encounter: Payer: Medicaid Other | Admitting: Nurse Practitioner

## 2022-04-10 ENCOUNTER — Telehealth: Payer: Self-pay | Admitting: Nurse Practitioner

## 2022-04-10 NOTE — Telephone Encounter (Signed)
Pt was a no show 1/11 for CPE with Lauren. This is her first, letter sent

## 2022-04-16 NOTE — Telephone Encounter (Signed)
Noted  

## 2022-04-16 NOTE — Telephone Encounter (Signed)
1st no show, fee waived, letter sent 

## 2022-06-04 ENCOUNTER — Ambulatory Visit
Admission: EM | Admit: 2022-06-04 | Discharge: 2022-06-04 | Disposition: A | Discharge status: Home / Self Care | Payer: Medicaid Other | Attending: Internal Medicine | Admitting: Internal Medicine

## 2022-06-04 DIAGNOSIS — I10 Essential (primary) hypertension: Secondary | ICD-10-CM

## 2022-06-04 DIAGNOSIS — R070 Pain in throat: Secondary | ICD-10-CM | POA: Diagnosis not present

## 2022-06-04 DIAGNOSIS — J069 Acute upper respiratory infection, unspecified: Secondary | ICD-10-CM

## 2022-06-04 LAB — POCT RAPID STREP A (OFFICE): Rapid Strep A Screen: NEGATIVE

## 2022-06-04 MED ORDER — CETIRIZINE HCL 10 MG PO TABS: 10.0000 mg | ORAL_TABLET | Freq: Every day | ORAL | 0 refills | Status: DC

## 2022-06-04 MED ORDER — PREDNISONE 10 MG PO TABS: 30.0000 mg | ORAL_TABLET | Freq: Every day | ORAL | 0 refills | Status: DC

## 2022-06-04 MED ORDER — PROMETHAZINE-DM 6.25-15 MG/5ML PO SYRP: 5.0000 mL | ORAL_SOLUTION | Freq: Three times a day (TID) | ORAL | 0 refills | Status: DC | PRN

## 2022-06-04 NOTE — ED Provider Notes (Signed)
[facility]  Note:  This document was prepared using Systems analyst and may include unintentional dictation errors.  MRN: [id number] DOB: 04-05-1985  Subjective:   Sandy Townsend is a 37 y.o. female presenting for 4-day history of acute onset throat pain, painful swallowing, sinus congestion and sinus drainage, productive cough.  Has had some streaks of blood in the mucus she is spitting out.  No chest pain, shortness of breath or wheezing.  Her primary symptom is sore throat.  Does not want a COVID test.  Regarding her blood pressure, patient reports compliance.  She does have a PCP she can follow-up with.  No current facility-administered medications for this encounter.  Current Outpatient Medications:    levonorgestrel (LILETTA, 52 MG,) 19.5 MCG/DAY IUD IUD, 1 each by Intrauterine route once., Disp: , Rfl:    Multiple Vitamins-Calcium (ONE-A-DAY WITHIN PO), Take by mouth., Disp: , Rfl:    NIFEdipine (PROCARDIA XL/NIFEDICAL XL) 60 MG 24 hr tablet, Take 1 tablet (60 mg total) by mouth daily., Disp: 90 tablet, Rfl: 1   sertraline (ZOLOFT) 50 MG tablet, Take 1 tablet (50 mg total) by mouth daily., Disp: 90 tablet, Rfl: 1   Allergies  Allergen Reactions   Iodine Anaphylaxis   Shellfish Allergy Anaphylaxis and Swelling   Pollen Extract Itching    Past Medical History:  Diagnosis Date   Anxiety    Depression    Exposure to TB    Heart murmur    never had any problems, dx as a child   Hypertension      Past Surgical History:  Procedure Laterality Date   BLADDER SUSPENSION N/A 09/13/2019   Procedure: TRANSVAGINAL TAPE (TVT) PROCEDURE;  Surgeon: Waymon Amato, MD;  Location: [facility];  Service: Gynecology;  Laterality: N/A;   CYSTOSCOPY N/A 09/13/2019   Procedure: CYSTOSCOPY;  Surgeon: Waymon Amato, MD;  Location: [place] [facility];  Service: Gynecology;  Laterality: N/A;   WISDOM TOOTH EXTRACTION      Family History  Problem Relation Age of Onset    Diabetes Mother    Hypercalcemia Mother    Hypertension Mother    Hypertension Father    Diabetes Paternal Grandmother    Diabetes Paternal Grandfather     Social History   Tobacco Use   Smoking status: Never   Smokeless tobacco: Never  Vaping Use   Vaping Use: Never used  Substance Use Topics   Alcohol use: Yes    Alcohol/week: 2.0 standard drinks of alcohol    Types: 2 Glasses of wine per week    Comment: occassionally   Drug use: Never    ROS   Objective:   Vitals: BP (!) 152/106 (BP Location: Right Arm)   Pulse 87   Temp 99.1 F (37.3 C) (Oral)   Resp 20   SpO2 97%   Physical Exam Constitutional:      General: She is not in acute distress.    Appearance: Normal appearance. She is well-developed and normal weight. She is not ill-appearing, toxic-appearing or diaphoretic.  HENT:     Head: Normocephalic and atraumatic.     Right Ear: Tympanic membrane, ear canal and external ear normal. No drainage or tenderness. No middle ear effusion. There is no impacted cerumen. Tympanic membrane is not erythematous or bulging.     Left Ear: Tympanic membrane, ear canal and external ear normal. No drainage or tenderness.  No middle ear effusion. There is no impacted cerumen. Tympanic membrane is not erythematous  or bulging.     Nose: Congestion present. No rhinorrhea.     Mouth/Throat:     Mouth: Mucous membranes are moist. No oral lesions.     Pharynx: No pharyngeal swelling, oropharyngeal exudate, posterior oropharyngeal erythema or uvula swelling.     Tonsils: No tonsillar exudate or tonsillar abscesses.     Comments: Thick streaks of postnasal drainage overlying pharynx. Eyes:     General: No scleral icterus.       Right eye: No discharge.        Left eye: No discharge.     Extraocular Movements: Extraocular movements intact.     Right eye: Normal extraocular motion.     Left eye: Normal extraocular motion.     Conjunctiva/sclera: Conjunctivae normal.   Cardiovascular:     Rate and Rhythm: Normal rate and regular rhythm.     Heart sounds: Normal heart sounds. No murmur heard.    No friction rub. No gallop.  Pulmonary:     Effort: Pulmonary effort is normal. No respiratory distress.     Breath sounds: No stridor. No wheezing, rhonchi or rales.  Chest:     Chest wall: No tenderness.  Musculoskeletal:     Cervical back: Normal range of motion and neck supple.  Lymphadenopathy:     Cervical: No cervical adenopathy.  Skin:    General: Skin is warm and dry.  Neurological:     General: No focal deficit present.     Mental Status: She is alert and oriented to person, place, and time.  Psychiatric:        Mood and Affect: Mood normal.        Behavior: Behavior normal.     Results for orders placed or performed during the hospital encounter of 06/04/22 (from the past 24 hour(s))  POCT rapid strep A     Status: None   Collection Time: 06/04/22  5:09 PM  Result Value Ref Range   Rapid Strep A Screen Negative Negative    Assessment and Plan :   PDMP not reviewed this encounter.  1. Viral upper respiratory infection   2. Throat pain   3. Essential hypertension     Given severity of her symptoms, offered an oral prednisone course.  Recommend supportive care otherwise for an acute viral upper respiratory infection.  Throat culture pending.  Monitor blood pressure readings, emphasized low-sodium diet and recheck with her PCP. Counseled patient on potential for adverse effects with medications prescribed/recommended today, ER and return-to-clinic precautions discussed, patient verbalized understanding.    Jaynee Eagles, [place] 06/04/22 1851

## 2022-06-04 NOTE — Discharge Instructions (Signed)
We will manage this as a viral upper respiratory infection. For sore throat or cough try using a honey-based tea. Use 3 teaspoons of honey with juice squeezed from half lemon. Place shaved pieces of ginger into 1/2-1 cup of water and warm over stove top. Then mix the ingredients and repeat every 4 hours as needed. Please take Tylenol '500mg'$ -'650mg'$  once every 6 hours for fevers, aches and pains. Hydrate very well with at least 2 liters (64 ounces) of water. Eat light meals such as soups (chicken and noodles, chicken wild rice, vegetable).  Do not eat any foods that you are allergic to.  Start an antihistamine like Zyrtec ('10mg'$  daily) for postnasal drainage, sinus congestion.  You can take this together with prednisone course with cough medications.

## 2022-06-04 NOTE — ED Triage Notes (Signed)
Pt c/o prod green cough, sinus pain/congestion, sore throat x 4 days-NAD-steady gait

## 2022-06-07 LAB — CULTURE, GROUP A STREP (THRC)

## 2022-06-09 ENCOUNTER — Telehealth (HOSPITAL_COMMUNITY): Payer: Self-pay | Admitting: Emergency Medicine

## 2022-06-09 MED ORDER — AZITHROMYCIN 250 MG PO TABS: 250.0000 mg | ORAL_TABLET | Freq: Every day | ORAL | 0 refills | Status: DC

## 2022-10-08 ENCOUNTER — Encounter: Payer: Medicaid Other | Admitting: Nurse Practitioner

## 2022-10-08 ENCOUNTER — Telehealth: Payer: Self-pay | Admitting: Nurse Practitioner

## 2022-10-08 NOTE — Telephone Encounter (Signed)
Pt was a no show for a cpe with Lauren on 10/08/22, I did not send a letter and blocked her from scheduling.

## 2022-10-21 NOTE — Telephone Encounter (Signed)
2nd no show, cannot bill fee [facility] plan), final warning sent via mychart

## 2022-10-21 NOTE — Telephone Encounter (Signed)
Noted  

## 2022-11-27 ENCOUNTER — Encounter: Payer: Self-pay | Admitting: Internal Medicine

## 2022-11-27 ENCOUNTER — Ambulatory Visit (INDEPENDENT_AMBULATORY_CARE_PROVIDER_SITE_OTHER): Payer: Medicaid Other | Admitting: Internal Medicine

## 2022-11-27 VITALS — BP 160/110 | HR 87 | Temp 98.8°F | Ht 62.5 in | Wt 175.0 lb

## 2022-11-27 DIAGNOSIS — I10 Essential (primary) hypertension: Secondary | ICD-10-CM | POA: Diagnosis not present

## 2022-11-27 DIAGNOSIS — F411 Generalized anxiety disorder: Secondary | ICD-10-CM | POA: Insufficient documentation

## 2022-11-27 DIAGNOSIS — F331 Major depressive disorder, recurrent, moderate: Secondary | ICD-10-CM | POA: Diagnosis not present

## 2022-11-27 DIAGNOSIS — Z13220 Encounter for screening for lipoid disorders: Secondary | ICD-10-CM

## 2022-11-27 LAB — LIPID PANEL
Cholesterol: 149 mg/dL (ref 0–200)
HDL: 41.7 mg/dL (ref >39.00)
LDL Cholesterol: 97 mg/dL (ref 0–99)
NonHDL: 107.24
Total CHOL/HDL Ratio: 4
Triglycerides: 53 mg/dL (ref 0.0–149.0)
VLDL: 10.6 mg/dL (ref 0.0–40.0)

## 2022-11-27 LAB — COMPREHENSIVE METABOLIC PANEL
ALT: 26 U/L (ref 0–35)
AST: 21 U/L (ref 0–37)
Albumin: 4.5 g/dL (ref 3.5–5.2)
Alkaline Phosphatase: 44 U/L (ref 39–117)
BUN: 7 mg/dL (ref 6–23)
CO2: 27 meq/L (ref 19–32)
Calcium: 9.3 mg/dL (ref 8.4–10.5)
Chloride: 104 meq/L (ref 96–112)
Creatinine, Ser: 0.76 mg/dL (ref 0.40–1.20)
GFR: 101.89 mL/min (ref >60.00)
Glucose, Bld: 87 mg/dL (ref 70–99)
Potassium: 3.4 meq/L — ABNORMAL LOW (ref 3.5–5.1)
Sodium: 138 meq/L (ref 135–145)
Total Bilirubin: 0.9 mg/dL (ref 0.2–1.2)
Total Protein: 7.5 g/dL (ref 6.0–8.3)

## 2022-11-27 MED ORDER — SERTRALINE HCL 50 MG PO TABS: ORAL_TABLET | ORAL | 1 refills | Status: DC

## 2022-11-27 MED ORDER — NIFEDIPINE ER OSMOTIC RELEASE 60 MG PO TB24: 60.0000 mg | ORAL_TABLET | Freq: Every day | ORAL | 1 refills | Status: DC

## 2022-11-27 NOTE — Progress Notes (Signed)
[facility] [address] [place] [address] Dept: [phone] Dept Fax: [phone]  Acute Care Office Visit  Subjective:   Sandy Townsend 1985/05/05 11/27/2022  Chief Complaint  Patient presents with   Hypertension   Migraine    HPI: HYPERTENSION: Sandy Townsend presents for the medical management of hypertension.  Patient's current hypertension medication regimen is: Nifedipine XL 60mg  PO daily  Patient is NOT currently taking prescribed medications for HTN. Ran out of medication several months ago. Has not been able to make follow up appointments for med refills.  Patient is NOT regularly keeping a check on BP at home. Did check it this morning before going to work which was high, 140/118. Patient went to work, had nurse check it at 160/118. Was sent from work to PCP office.  Patient also reports having increased life stressors (work, 4 kids who started school) which are also not helping her BP.  Associated headache, dizziness, "stars" in vision.  Denies CP, SHOB, gait instability, numbness/tingling in extremities.  BP Readings from Last 3 Encounters:  11/27/22 (!) 160/110  06/04/22 (!) 152/106  10/08/21 128/84    ANXIETY/DEPRESSION: Sandy Townsend presents for the medical management of anxiety and depression.  Current medication regimen: none, has had Zoloft in the past but did not consistently take medication enough to know if it worked well.   She reports feeling hopeless over this past summer. Very stressed with trying to provide for her 4 children. Lost her house this summer, is living with a friend. Her 3 high school age children are living with her, and her 3yo is living with her child's father. She is also having trouble with child support as her child's father has not been paying over the past several months, which has added financial stress to patient. She does have a lawyer helping her with this. Is able to get  groceries and have food.   Counseling: no Well controlled: no Denies SI/HI.     09/09/2021    9:31 AM 08/12/2021    2:44 PM 08/02/2018    8:20 AM 01/27/2018    2:00 PM  GAD 7 : Generalized Anxiety Score  Nervous, Anxious, on Edge  2 0 0  Control/stop worrying 0 3 0 0  Worry too much - different things 1 3 0 1  Trouble relaxing 1 3 1 1   Restless 1 1 3 1   Easily annoyed or irritable 1 2 2  0  Afraid - awful might happen 0 0 1 0  Total GAD 7 Score  14 7 3   Anxiety Difficulty Somewhat difficult  Not difficult at all --      11/27/2022   10:24 AM 09/09/2021    9:30 AM 08/12/2021    2:44 PM  PHQ9 SCORE ONLY  PHQ-9 Total Score 6 10 21      The following portions of the patient's history were reviewed and updated as appropriate: past medical history, past surgical history, family history, social history, allergies, medications, and problem list.   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   Anxiety 08/12/2021   Allergic rhinitis 07/09/2018   Contraception management 07/09/2018   Primary hypertension 07/09/2018   Urge incontinence 01/24/2018   Major depression, recurrent (HCC) 12/30/2017   Past Medical History:  Diagnosis Date   Anxiety    Depression    Exposure to TB    Heart murmur  never had any problems, dx as a child   Hypertension    Past Surgical History:  Procedure Laterality Date   BLADDER SUSPENSION N/A 09/13/2019   Procedure: TRANSVAGINAL TAPE (TVT) PROCEDURE;  Surgeon: Hoover Browns, MD;  Location: [facility] OR;  Service: Gynecology;  Laterality: N/A;   CYSTOSCOPY N/A 09/13/2019   Procedure: CYSTOSCOPY;  Surgeon: Hoover Browns, MD;  Location: [facility] OR;  Service: Gynecology;  Laterality: N/A;   WISDOM TOOTH EXTRACTION     Family History  Problem Relation Age of Onset   Diabetes Mother    Hypercalcemia Mother    Hypertension Mother    Hypertension Father    Diabetes Paternal Grandmother     Diabetes Paternal Grandfather     Current Outpatient Medications:    cetirizine (ZYRTEC ALLERGY) 10 MG tablet, Take 1 tablet (10 mg total) by mouth daily., Disp: 30 tablet, Rfl: 0   levonorgestrel (LILETTA, 52 MG,) 19.5 MCG/DAY IUD IUD, 1 each by Intrauterine route once., Disp: , Rfl:    Multiple Vitamins-Calcium (ONE-A-DAY WITHIN PO), Take by mouth., Disp: , Rfl:    NIFEdipine (PROCARDIA XL/NIFEDICAL XL) 60 MG 24 hr tablet, Take 1 tablet (60 mg total) by mouth daily., Disp: 90 tablet, Rfl: 1   promethazine-dextromethorphan (PROMETHAZINE-DM) 6.25-15 MG/5ML syrup, Take 5 mLs by mouth 3 (three) times daily as needed for cough. (Patient not taking: Reported on 11/27/2022), Disp: 200 mL, Rfl: 0   sertraline (ZOLOFT) 50 MG tablet, Take 1/2 tablet by mouth once daily for 5 days. Then take 1 tablet by mouth once daily., Disp: 90 tablet, Rfl: 1 Allergies  Allergen Reactions   Iodine Anaphylaxis   Shellfish Allergy Anaphylaxis and Swelling   Pollen Extract Itching     ROS: A complete ROS was performed with pertinent positives/negatives noted in the HPI. The remainder of the ROS are negative.    Objective:   Today's Vitals   11/27/22 1025 11/27/22 1121  BP: (!) 158/110 (!) 160/110  Pulse: 87   Temp: 98.8 F (37.1 C)   TempSrc: Temporal   SpO2: 98%   Weight: 175 lb (79.4 kg)   Height: 5' 2.5" (1.588 m)     GENERAL: Well-appearing, in NAD. Well nourished.  SKIN: Pink, warm and dry. No rash, lesion, ulceration, or ecchymoses.  NECK: Trachea midline. Full ROM w/o pain or tenderness. No lymphadenopathy.  RESPIRATORY: Chest wall symmetrical. Respirations even and non-labored. Breath sounds clear to auscultation bilaterally.  CARDIAC: S1, S2 present, regular rate and rhythm. Peripheral pulses 2+ bilaterally.  EXTREMITIES: Without clubbing, cyanosis, or edema.  NEUROLOGIC: No motor or sensory deficits. Steady, even gait.  PSYCH/MENTAL STATUS: Alert, oriented x 3. Cooperative, tearful.   EKG  RESULT: EKG tracing is personally reviewed.   EKG: normal EKG, normal sinus rhythm.   No results found for any visits on 11/27/22.    Assessment & Plan:  1. Primary hypertension - EKG 12-Lead - Comprehensive metabolic panel - TSH - NIFEdipine (PROCARDIA XL/NIFEDICAL XL) 60 MG 24 hr tablet; Take 1 tablet (60 mg total) by mouth daily.  Dispense: 90 tablet; Refill: 1 -Check blood pressure at home 2-3 times a week.  Write down BP readings and bring to next office appointment.  Goal is less than 140/90.  2. Generalized anxiety disorder - sertraline (ZOLOFT) 50 MG tablet; Take 1/2 tablet by mouth once daily for 5 days. Then take 1 tablet by mouth once daily.  Dispense: 90 tablet; Refill: 1  3. Moderate episode of recurrent major depressive disorder (HCC) -  sertraline (ZOLOFT) 50 MG tablet; Take 1/2 tablet by mouth once daily for 5 days. Then take 1 tablet by mouth once daily.  Dispense: 90 tablet; Refill: 1  4. Screening for lipid disorders - Lipid panel   Meds ordered this encounter  Medications   NIFEdipine (PROCARDIA XL/NIFEDICAL XL) 60 MG 24 hr tablet    Sig: Take 1 tablet (60 mg total) by mouth daily.    Dispense:  90 tablet    Refill:  1    Order Specific Question:   Supervising Provider    Answer:   Garnette Gunner [[id number]]   sertraline (ZOLOFT) 50 MG tablet    Sig: Take 1/2 tablet by mouth once daily for 5 days. Then take 1 tablet by mouth once daily.    Dispense:  90 tablet    Refill:  1    Order Specific Question:   Supervising Provider    Answer:   Garnette Gunner [[id number]]   Orders Placed This Encounter  Procedures   Comprehensive metabolic panel   Lipid panel   TSH   EKG 12-Lead   Lab Orders         Comprehensive metabolic panel         Lipid panel         TSH     No images are attached to the encounter or orders placed in the encounter.  Return in about 6 weeks (around 01/08/2023) for Hypertension, Anxiety/Depression.   Salvatore Decent, FNP

## 2022-11-27 NOTE — Patient Instructions (Addendum)
BLOOD PRESSURE: Check your blood pressure at home 2-3 times a week if possible, write down blood pressure readings and bring to next appointment.  Goal is BP less than 140/90 consistently.

## 2022-11-28 LAB — TSH: TSH: 0.58 u[IU]/mL (ref 0.35–5.50)

## 2022-12-18 ENCOUNTER — Telehealth: Payer: Self-pay

## 2022-12-18 NOTE — Progress Notes (Signed)
12/18/2022  Patient ID: Sandy Townsend, female   DOB: 1985/11/13, 37 y.o.   MRN: [id number]  Pharmacy Quality Measure Review  This patient is appearing on a report for being at risk of failing the Controlling Blood Pressure measure this calendar year.   Last documented BP 160/110 on 8/29  Outreach attempt to discuss control of BP unsuccessful and patient's voicemail box was full.  Lenna Gilford, PharmD, DPLA

## 2023-01-08 ENCOUNTER — Encounter: Payer: Self-pay | Admitting: Nurse Practitioner

## 2023-01-08 ENCOUNTER — Ambulatory Visit: Payer: Medicaid Other | Admitting: Nurse Practitioner

## 2023-01-08 ENCOUNTER — Telehealth: Payer: Self-pay | Admitting: Nurse Practitioner

## 2023-01-08 NOTE — Telephone Encounter (Signed)
Pt was a no show for an OV with Lauren on 01/08/23, I did not send a letter, I did dismiss.

## 2023-01-08 NOTE — Telephone Encounter (Signed)
3rd no show 04/10/2022 10/08/2022 01/08/2023  Dismissal generated

## 2023-01-08 NOTE — Telephone Encounter (Signed)
Noted  

## 2023-01-20 NOTE — Telephone Encounter (Signed)
Sent dismissal reversal letter to pt via mychart

## 2023-01-20 NOTE — Telephone Encounter (Signed)
Pt came in office 01/20/2023 stating her daughter was taken to the ER 01/08/2023 for a seizure and is still in the hospital. She had so much going on she forgot to call the office. Advised pt we would reverse dismissal and give her another chance. Additional missed visit would lead to dismissal. Pt understood.  Does provider agree?

## 2023-01-31 ENCOUNTER — Emergency Department (HOSPITAL_COMMUNITY): Payer: Medicaid Other

## 2023-01-31 ENCOUNTER — Emergency Department (HOSPITAL_COMMUNITY)
Admission: EM | Admit: 2023-01-31 | Discharge: 2023-01-31 | Disposition: A | Discharge status: Home / Self Care | Payer: Medicaid Other | Attending: Emergency Medicine | Admitting: Emergency Medicine

## 2023-01-31 ENCOUNTER — Encounter (HOSPITAL_COMMUNITY): Payer: Self-pay | Admitting: Emergency Medicine

## 2023-01-31 DIAGNOSIS — S8392XA Sprain of unspecified site of left knee, initial encounter: Secondary | ICD-10-CM | POA: Insufficient documentation

## 2023-01-31 DIAGNOSIS — X501XXA Overexertion from prolonged static or awkward postures, initial encounter: Secondary | ICD-10-CM | POA: Diagnosis not present

## 2023-01-31 DIAGNOSIS — S8992XA Unspecified injury of left lower leg, initial encounter: Secondary | ICD-10-CM | POA: Diagnosis present

## 2023-01-31 DIAGNOSIS — M25462 Effusion, left knee: Secondary | ICD-10-CM | POA: Diagnosis not present

## 2023-01-31 DIAGNOSIS — M25562 Pain in left knee: Secondary | ICD-10-CM | POA: Diagnosis not present

## 2023-01-31 MED ORDER — OXYCODONE HCL 5 MG PO TABS: 5.0000 mg | ORAL_TABLET | Freq: Four times a day (QID) | ORAL | 0 refills | Status: DC | PRN

## 2023-01-31 MED ORDER — IBUPROFEN 400 MG PO TABS
400.0000 mg | ORAL_TABLET | Freq: Once | ORAL | Status: AC
  Administered 2023-01-31: 400 mg via ORAL
  Filled 2023-01-31: qty 1

## 2023-01-31 MED ORDER — OXYCODONE-ACETAMINOPHEN 5-325 MG PO TABS
1.0000 | ORAL_TABLET | Freq: Once | ORAL | Status: AC
  Administered 2023-01-31: 1 via ORAL
  Filled 2023-01-31: qty 1

## 2023-01-31 NOTE — ED Triage Notes (Signed)
Pt here from home with c/o left knee pain after hearing a pop in it last night doing a "kick line" with her daughter at the football game , knee is slight swollen and painful to bend

## 2023-01-31 NOTE — ED Provider Notes (Signed)
[facility] [facility] EMERGENCY DEPARTMENT AT [facility] Provider Note   CSN: [id number] Arrival date & time: 01/31/23  1517     History  Chief Complaint  Patient presents with   Knee Pain    Sandy Townsend is a 37 y.o. female.  Patient is a 37 year old female with no significant past medical history presenting to the emergency department with left knee pain.  The patient states that she was doing a kick line with her daughter last night when she felt a sudden pop in her left knee.  She states that she has had significant pain with ambulating on it since and has had increased swelling.  She denies any numbness or weakness in her legs.  The history is provided by the patient.  Knee Pain      Home Medications Prior to Admission medications   Medication Sig Start Date End Date Taking? Authorizing Provider  oxyCODONE (ROXICODONE) 5 MG immediate release tablet Take 1 tablet (5 mg total) by mouth every 6 (six) hours as needed. 01/31/23  Yes Theresia Lo, Benetta Spar K, DO  cetirizine (ZYRTEC ALLERGY) 10 MG tablet Take 1 tablet (10 mg total) by mouth daily. 06/04/22   Wallis Bamberg, PA-C  levonorgestrel (LILETTA, 52 MG,) 19.5 MCG/DAY IUD IUD 1 each by Intrauterine route once.    [provider]  Multiple Vitamins-Calcium (ONE-A-DAY WITHIN PO) Take by mouth.    [provider]  NIFEdipine (PROCARDIA XL/NIFEDICAL XL) 60 MG 24 hr tablet Take 1 tablet (60 mg total) by mouth daily. 11/27/22   Salvatore Decent, FNP  promethazine-dextromethorphan (PROMETHAZINE-DM) 6.25-15 MG/5ML syrup Take 5 mLs by mouth 3 (three) times daily as needed for cough. Patient not taking: Reported on 11/27/2022 06/04/22   Wallis Bamberg, PA-C  sertraline (ZOLOFT) 50 MG tablet Take 1/2 tablet by mouth once daily for 5 days. Then take 1 tablet by mouth once daily. 11/27/22   Salvatore Decent, FNP      Allergies    Iodine, Shellfish allergy, and Pollen extract    Review of Systems   Review of Systems  Physical  Exam Updated Vital Signs BP 128/76 (BP Location: Right Arm)   Pulse 90   Temp 98.9 F (37.2 C)   Resp 16   SpO2 100%  Physical Exam Vitals and nursing note reviewed.  Constitutional:      General: She is not in acute distress.    Appearance: Normal appearance.  HENT:     Head: Normocephalic and atraumatic.     Nose: Nose normal.     Mouth/Throat:     Mouth: Mucous membranes are moist.  Eyes:     Extraocular Movements: Extraocular movements intact.  Cardiovascular:     Rate and Rhythm: Normal rate.     Pulses: Normal pulses.  Pulmonary:     Effort: Pulmonary effort is normal.  Musculoskeletal:        General: Normal range of motion.     Cervical back: Normal range of motion.     Comments: Tenderness to palpation medially and anteriorly of left knee with palpable knee joint effusion, flexion/extension intact, no knee joint laxity, no bony tenderness to ankle or hip  Skin:    General: Skin is warm and dry.     Findings: No bruising.  Neurological:     General: No focal deficit present.     Mental Status: She is alert and oriented to person, place, and time.     Sensory: No sensory deficit.     Motor:  No weakness.  Psychiatric:        Mood and Affect: Mood normal.        Behavior: Behavior normal.     ED Results / Procedures / Treatments   Labs (all labs ordered are listed, but only abnormal results are displayed) Labs Reviewed - No data to display  EKG None  Radiology DG Knee 2 Views Left  Result Date: 01/31/2023 CLINICAL DATA:  Left knee pain and swelling EXAM: LEFT KNEE - 1-2 VIEW COMPARISON:  None Available. FINDINGS: No evidence of fracture or dislocation. Moderate-sized knee joint effusion. No evidence of arthropathy or other focal bone abnormality. Soft tissues are unremarkable. IMPRESSION: Moderate-sized knee joint effusion. No acute fracture or dislocation. Electronically Signed   By: Duanne Guess D.O.   On: 01/31/2023 15:44    Procedures Procedures     Medications Ordered in ED Medications  oxyCODONE-acetaminophen (PERCOCET/ROXICET) 5-325 MG per tablet 1 tablet (1 tablet Oral Given 01/31/23 1716)  ibuprofen (ADVIL) tablet 400 mg (400 mg Oral Given 01/31/23 1716)    ED Course/ Medical Decision Making/ A&P                                 Medical Decision Making This patient presents to the ED with chief complaint(s) of knee pain with no pertinent past medical history which further complicates the presenting complaint. The complaint involves an extensive differential diagnosis and also carries with it a high risk of complications and morbidity.    The differential diagnosis includes fracture, dislocation, knee sprain, ligamentous injury  Additional history obtained: Additional history obtained from N/A Records reviewed N/A  ED Course and Reassessment: On patient's arrival she is hemodynamically stable in no acute distress.  Was initially evaluated by triage and had knee x-ray performed.  X-ray showed no bony abnormality but showed moderate knee joint effusion.  She has no knee joint laxity but does have a palpable effusion and exam is consistent with likely ligamentous injury.  She does have a knee immobilizer with her and will be given crutches and pain control.  She was recommended orthopedic follow-up and was given strict return precautions.  Independent labs interpretation:  N/A  Independent visualization of imaging: - I independently visualized the following imaging with scope of interpretation limited to determining acute life threatening conditions related to emergency care: L knee XR, which revealed knee effusion, no bony abnormality  Consultation: - Consulted or discussed management/test interpretation w/ external professional: N/A  Consideration for admission or further workup: Patient has no emergent conditions requiring admission or further work-up at this time and is stable for discharge home with primary care  follow-up  Social Determinants of health: N/A    Amount and/or Complexity of Data Reviewed Radiology: ordered.  Risk Prescription drug management.          Final Clinical Impression(s) / ED Diagnoses Final diagnoses:  Sprain of left knee, unspecified ligament, initial encounter  Effusion of left knee    Rx / DC Orders ED Discharge Orders          Ordered    oxyCODONE (ROXICODONE) 5 MG immediate release tablet  Every 6 hours PRN        01/31/23 1814              Rexford Maus, DO 01/31/23 1816

## 2023-01-31 NOTE — Discharge Instructions (Signed)
You were seen in the emergency department for your knee pain.  Your x-ray showed no broken bones but you do have fluid in your knee and its likely related to an injury to one of the ligaments in your knee.  You should wear your knee immobilizer anytime that you are ambulating and I have given you crutches to help with walking.  You can take Tylenol and Motrin every 6 hours as needed for pain and I have given you oxycodone for breakthrough pain.  Do not take it while working, driving or operating heavy machinery as it can make you drowsy.  You should ice your knee and keep it elevated to help with the swelling.  You can follow-up with orthopedics within the next week to have your symptoms rechecked.  You should return to the emergency department for significantly worsening pain, numbness or weakness in your leg or any other new or concerning symptoms.

## 2023-01-31 NOTE — Progress Notes (Signed)
Orthopedic Tech Progress Note Patient Details:  Sandy Townsend June 18, 1985 [id number]  Ortho Devices Type of Ortho Device: Knee Sleeve Ortho Device/Splint Location: Left knee Ortho Device/Splint Interventions: Application   Post Interventions Patient Tolerated: Well  Sandy Townsend 01/31/2023, 5:43 PM

## 2023-01-31 NOTE — ED Notes (Signed)
ORTHO ARRIVED AT BEDSIDE FOR KNEE BRACE

## 2023-02-02 DIAGNOSIS — M25562 Pain in left knee: Secondary | ICD-10-CM | POA: Diagnosis not present

## 2023-02-07 DIAGNOSIS — M7122 Synovial cyst of popliteal space [Baker], left knee: Secondary | ICD-10-CM | POA: Diagnosis not present

## 2023-02-07 DIAGNOSIS — M25562 Pain in left knee: Secondary | ICD-10-CM | POA: Diagnosis not present

## 2023-02-07 DIAGNOSIS — M25462 Effusion, left knee: Secondary | ICD-10-CM | POA: Diagnosis not present

## 2023-02-07 DIAGNOSIS — S83412A Sprain of medial collateral ligament of left knee, initial encounter: Secondary | ICD-10-CM | POA: Diagnosis not present

## 2023-02-16 DIAGNOSIS — M2392 Unspecified internal derangement of left knee: Secondary | ICD-10-CM | POA: Diagnosis not present

## 2023-02-16 DIAGNOSIS — S83512D Sprain of anterior cruciate ligament of left knee, subsequent encounter: Secondary | ICD-10-CM | POA: Diagnosis not present

## 2023-02-16 DIAGNOSIS — M25562 Pain in left knee: Secondary | ICD-10-CM | POA: Diagnosis not present

## 2023-03-09 DIAGNOSIS — S83512D Sprain of anterior cruciate ligament of left knee, subsequent encounter: Secondary | ICD-10-CM | POA: Diagnosis not present

## 2023-06-08 ENCOUNTER — Other Ambulatory Visit: Payer: Self-pay | Admitting: Internal Medicine

## 2023-06-08 DIAGNOSIS — I10 Essential (primary) hypertension: Secondary | ICD-10-CM

## 2023-10-29 ENCOUNTER — Other Ambulatory Visit: Payer: Self-pay | Admitting: Nurse Practitioner

## 2023-10-29 NOTE — Telephone Encounter (Signed)
 Requesting: VALACYCLOVIR  1GM TABLETS  Last Visit: 11/22/2022 with Billy Next Visit: 11/19/2023 Last Refill: 10/08/2021  Please Advise

## 2023-11-19 ENCOUNTER — Ambulatory Visit: Payer: Self-pay | Admitting: Nurse Practitioner

## 2023-11-19 ENCOUNTER — Ambulatory Visit: Admitting: Nurse Practitioner

## 2023-11-19 ENCOUNTER — Other Ambulatory Visit (HOSPITAL_COMMUNITY)
Admission: RE | Admit: 2023-11-19 | Discharge: 2023-11-19 | Disposition: A | Discharge status: Home / Self Care | Source: Ambulatory Visit | Attending: Nurse Practitioner | Admitting: Nurse Practitioner

## 2023-11-19 ENCOUNTER — Encounter: Payer: Self-pay | Admitting: Nurse Practitioner

## 2023-11-19 VITALS — BP 148/100 | HR 61 | Temp 97.2°F | Ht 61.0 in | Wt 179.2 lb

## 2023-11-19 DIAGNOSIS — F331 Major depressive disorder, recurrent, moderate: Secondary | ICD-10-CM

## 2023-11-19 DIAGNOSIS — G8929 Other chronic pain: Secondary | ICD-10-CM | POA: Diagnosis not present

## 2023-11-19 DIAGNOSIS — Z0000 Encounter for general adult medical examination without abnormal findings: Secondary | ICD-10-CM | POA: Diagnosis not present

## 2023-11-19 DIAGNOSIS — F419 Anxiety disorder, unspecified: Secondary | ICD-10-CM | POA: Diagnosis not present

## 2023-11-19 DIAGNOSIS — R5382 Chronic fatigue, unspecified: Secondary | ICD-10-CM

## 2023-11-19 DIAGNOSIS — Z124 Encounter for screening for malignant neoplasm of cervix: Secondary | ICD-10-CM | POA: Diagnosis not present

## 2023-11-19 DIAGNOSIS — I10 Essential (primary) hypertension: Secondary | ICD-10-CM | POA: Diagnosis not present

## 2023-11-19 DIAGNOSIS — M25562 Pain in left knee: Secondary | ICD-10-CM

## 2023-11-19 LAB — LIPID PANEL
Cholesterol: 188 mg/dL (ref 0–200)
HDL: 49.1 mg/dL (ref >39.00)
LDL Cholesterol: 126 mg/dL — ABNORMAL HIGH (ref 0–99)
NonHDL: 139.13
Total CHOL/HDL Ratio: 4
Triglycerides: 68 mg/dL (ref 0.0–149.0)
VLDL: 13.6 mg/dL (ref 0.0–40.0)

## 2023-11-19 LAB — CBC WITH DIFFERENTIAL/PLATELET
Basophils Absolute: 0 K/uL (ref 0.0–0.1)
Basophils Relative: 0.3 % (ref 0.0–3.0)
Eosinophils Absolute: 0 K/uL (ref 0.0–0.7)
Eosinophils Relative: 0.7 % (ref 0.0–5.0)
HCT: 40.2 % (ref 36.0–46.0)
Hemoglobin: 13.7 g/dL (ref 12.0–15.0)
Lymphocytes Relative: 23.7 % (ref 12.0–46.0)
Lymphs Abs: 1.6 K/uL (ref 0.7–4.0)
MCHC: 34 g/dL (ref 30.0–36.0)
MCV: 92.5 fl (ref 78.0–100.0)
Monocytes Absolute: 0.5 K/uL (ref 0.1–1.0)
Monocytes Relative: 7.5 % (ref 3.0–12.0)
Neutro Abs: 4.6 K/uL (ref 1.4–7.7)
Neutrophils Relative %: 67.8 % (ref 43.0–77.0)
Platelets: 283 K/uL (ref 150.0–400.0)
RBC: 4.35 Mil/uL (ref 3.87–5.11)
RDW: 13.2 % (ref 11.5–15.5)
WBC: 6.8 K/uL (ref 4.0–10.5)

## 2023-11-19 LAB — COMPREHENSIVE METABOLIC PANEL WITH GFR
ALT: 52 U/L — ABNORMAL HIGH (ref 0–35)
AST: 26 U/L (ref 0–37)
Albumin: 4.7 g/dL (ref 3.5–5.2)
Alkaline Phosphatase: 44 U/L (ref 39–117)
BUN: 10 mg/dL (ref 6–23)
CO2: 25 meq/L (ref 19–32)
Calcium: 9.1 mg/dL (ref 8.4–10.5)
Chloride: 105 meq/L (ref 96–112)
Creatinine, Ser: 0.82 mg/dL (ref 0.40–1.20)
GFR: 92.38 mL/min (ref >60.00)
Glucose, Bld: 91 mg/dL (ref 70–99)
Potassium: 3.7 meq/L (ref 3.5–5.1)
Sodium: 138 meq/L (ref 135–145)
Total Bilirubin: 0.9 mg/dL (ref 0.2–1.2)
Total Protein: 8 g/dL (ref 6.0–8.3)

## 2023-11-19 LAB — VITAMIN D 25 HYDROXY (VIT D DEFICIENCY, FRACTURES): VITD: 15.3 ng/mL — ABNORMAL LOW (ref 30.00–100.00)

## 2023-11-19 LAB — VITAMIN B12: Vitamin B-12: 178 pg/mL — ABNORMAL LOW (ref 211–911)

## 2023-11-19 LAB — TSH: TSH: 0.93 u[IU]/mL (ref 0.35–5.50)

## 2023-11-19 MED ORDER — NIFEDIPINE ER OSMOTIC RELEASE 60 MG PO TB24: 60.0000 mg | ORAL_TABLET | Freq: Every day | ORAL | 1 refills | Status: DC

## 2023-11-19 MED ORDER — SERTRALINE HCL 100 MG PO TABS: 100.0000 mg | ORAL_TABLET | Freq: Every day | ORAL | 1 refills | Status: DC

## 2023-11-19 MED ORDER — VITAMIN D (ERGOCALCIFEROL) 1.25 MG (50000 UNIT) PO CAPS: 50000.0000 [IU] | ORAL_CAPSULE | ORAL | 0 refills | Status: DC

## 2023-11-19 NOTE — Progress Notes (Signed)
 BP (!) 148/100 (BP Location: Left Arm, Cuff Size: Large)   Pulse 61   Temp (!) 97.2 F (36.2 C)   Ht 5' 1 (1.549 m)   Wt 179 lb 3.2 oz (81.3 kg)   SpO2 96%   BMI 33.86 kg/m    Subjective:    Patient ID: Sandy Townsend, female    DOB: 1985/05/22, 38 y.o.   MRN: [id number]  CC: Chief Complaint  Patient presents with   Annual Exam    With labs,PAP-patient is not fasting, concerns with mood and low energy and discuss medications, IUD for 3.5 years    HPI: Sandy Townsend is a 38 y.o. female presenting on 11/19/2023 for comprehensive medical examination. Current medical complaints include:depression, anxiety Discussed the use of AI scribe software for clinical note transcription with the patient, who gave verbal consent to proceed.  History of Present Illness   Sandy Townsend is a 38 year old female who presents with fatigue and anxiety.  She experiences significant fatigue and anxiety, which she attributes to stress from managing her daughter's medical condition and her own responsibilities. Despite being active at work, she feels constantly exhausted and struggles to engage in activities she once enjoyed.  She has sleep disturbances, feeling restless after 2 AM and having difficulty sleeping for extended periods. She adjusted her bedtime to around 10 PM, allowing her to sleep until 5 or 6 AM, but still does not achieve a full night's rest.  In busy environments, she experiences heightened anxiety and overstimulation, with symptoms of chest tightness and a buzzing sensation in her head. She seeks a safe space in these situations.  She is currently taking Zoloft  50mg  daily, but feels it is not effectively managing her symptoms. She has not been consistent with her blood pressure medication, nifedipine  60 mg daily, due to leaving it behind on a trip. She has not taken it for two weeks but reports stable blood pressure a few months ago despite high stress levels.     She currently lives with:  children Menopausal Symptoms: no  Depression and Anxiety Screen done today and results listed below:     11/19/2023    1:06 PM 11/27/2022   10:24 AM 09/09/2021    9:30 AM 08/12/2021    2:44 PM 12/09/2018   11:17 AM  Depression screen PHQ 2/9  Decreased Interest 1 3 1 3  0  Down, Depressed, Hopeless 1 3 1 3 1   PHQ - 2 Score 2 6 2 6 1   Altered sleeping 2  2 3 1   Tired, decreased energy 3  1 3 1   Change in appetite 1  2 2 1   Feeling bad or failure about yourself  1  1 3  0  Trouble concentrating 3  2 3  0  Moving slowly or fidgety/restless 3  0 1 0  Suicidal thoughts 1  0 0 0  PHQ-9 Score 16  10 21 4   Difficult doing work/chores Very difficult          11/19/2023    1:06 PM 09/09/2021    9:31 AM 08/12/2021    2:44 PM 08/02/2018    8:20 AM  GAD 7 : Generalized Anxiety Score  Nervous, Anxious, on Edge 1  2 0  Control/stop worrying 2 0 3 0  Worry too much - different things 3 1 3  0  Trouble relaxing 3 1 3 1   Restless 3 1 1 3   Easily annoyed or irritable 3 1 2 2   Afraid -  awful might happen 0 0 0 1  Total GAD 7 Score 15  14 7   Anxiety Difficulty Very difficult Somewhat difficult  Not difficult at all    The patient does not have a history of falls. I did not complete a risk assessment for falls. A plan of care for falls was not documented.   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   Multiple Vitamins-Calcium (ONE-A-DAY WITHIN PO) Take by mouth.   valACYclovir  (VALTREX ) 1000 MG tablet TAKE 1 TABLET BY MOUTH TWICE DAILY AS NEEDED FOR 14 DAYS   No current facility-administered medications on file prior to visit.    Allergies:  Allergies  Allergen Reactions   Iodine Anaphylaxis   Shellfish Allergy Anaphylaxis and Swelling   Pollen Extract Itching    Social History:  Social History   Socioeconomic History   Marital status: Single    Spouse name: Not on file   Number of children: Not on file   Years of education: Not on file   Highest education level: Not on file  Occupational History   Not on file  Tobacco Use   Smoking status: Never   Smokeless tobacco: Never  Vaping Use   Vaping status: Never Used  Substance and Sexual Activity   Alcohol use: Not Currently    Alcohol/week: 1.0 standard drink of alcohol    Types: 1 Glasses of wine per week    Comment: occassionally   Drug use: Never   Sexual activity: Yes    Birth control/protection: I.U.D.    Comment: need new IUD and pap smear  Other Topics Concern   Not on file  Social History Narrative   Not on file   Social Drivers of Health   Financial Resource Strain: High Risk (11/17/2023)   Overall Financial Resource Strain (CARDIA)    Difficulty of Paying Living Expenses: Hard  Food Insecurity: Patient Declined (11/17/2023)   Hunger Vital Sign    Worried About Running Out of Food in the Last Year: Patient declined    Ran Out of Food in the Last Year: Patient declined  Transportation Needs: Unmet Transportation Needs (11/17/2023)   PRAPARE - Administrator, Civil Service (Medical): Yes    Lack of Transportation (Non-Medical): Yes  Physical Activity:  Sufficiently Active (11/17/2023)   Exercise Vital Sign    Days of Exercise per Week: 6 days    Minutes of Exercise per Session: 70 min  Stress: Stress Concern Present (11/17/2023)   [employer] of Occupational Health - Occupational Stress Questionnaire  Feeling of Stress: Rather much  Social Connections: Moderately Isolated (11/17/2023)   Social Connection and Isolation Panel    Frequency of Communication with Friends and Family: Twice a week    Frequency of Social Gatherings with Friends and Family: Once a week    Attends Religious Services: Patient declined    Database administrator or Organizations: Yes    Attends Engineer, structural: More than 4 times per year    Marital Status: Never married  Catering manager Violence: Not on file   Social History   Tobacco Use  Smoking Status Never  Smokeless Tobacco Never   Social History   Substance and Sexual Activity  Alcohol Use Not Currently   Alcohol/week: 1.0 standard drink of alcohol   Types: 1 Glasses of wine per week   Comment: occassionally    Family History:  Family History  Problem Relation Age of Onset   Diabetes Mother    Hypercalcemia Mother    Hypertension Mother    Hypertension Father    Autoimmune disease Daughter        MOGAD   Learning disabilities Son    Diabetes Paternal Grandmother    Diabetes Paternal Grandfather     Past medical history, surgical history, medications, allergies, family history and social history reviewed with patient today and changes made to appropriate areas of the chart.   Review of Systems  Constitutional:  Positive for malaise/fatigue. Negative for fever.  HENT: Negative.    Eyes: Negative.   Respiratory: Negative.    Cardiovascular: Negative.   Gastrointestinal: Negative.   Genitourinary: Negative.   Musculoskeletal: Negative.   Skin: Negative.   Neurological:  Positive for headaches. Negative for dizziness.  Psychiatric/Behavioral:  Positive for  depression. The patient is nervous/anxious.    All other ROS negative except what is listed above and in the HPI.      Objective:    BP (!) 148/100 (BP Location: Left Arm, Cuff Size: Large)   Pulse 61   Temp (!) 97.2 F (36.2 C)   Ht 5' 1 (1.549 m)   Wt 179 lb 3.2 oz (81.3 kg)   SpO2 96%   BMI 33.86 kg/m   Wt Readings from Last 3 Encounters:  11/19/23 179 lb 3.2 oz (81.3 kg)  11/27/22 175 lb (79.4 kg)  10/08/21 172 lb (78 kg)    Physical Exam Vitals and nursing note reviewed. Exam conducted with a chaperone present.  Constitutional:      General: She is not in acute distress.    Appearance: Normal appearance.  HENT:     Head: Normocephalic and atraumatic.     Right Ear: Tympanic membrane, ear canal and external ear normal.     Left Ear: Tympanic membrane, ear canal and external ear normal.     Mouth/Throat:     Mouth: Mucous membranes are moist.     Pharynx: No posterior oropharyngeal erythema.  Eyes:     Conjunctiva/sclera: Conjunctivae normal.  Cardiovascular:     Rate and Rhythm: Normal rate and regular rhythm.     Pulses: Normal pulses.     Heart sounds: Normal heart sounds.  Pulmonary:     Effort: Pulmonary effort is normal.     Breath sounds: Normal breath sounds.  Abdominal:     Palpations: Abdomen is soft.     Tenderness: There is no abdominal tenderness.  Genitourinary:    General: Normal vulva.     Exam position: Lithotomy position.  Labia:        Right: No rash, tenderness or lesion.        Left: No rash, tenderness or lesion.      Vagina: Normal.     Cervix: Normal.     Uterus: Normal.      Adnexa: Right adnexa normal and left adnexa normal.  Musculoskeletal:        General: Normal range of motion.     Cervical back: Normal range of motion and neck supple.     Right lower leg: No edema.     Left lower leg: No edema.  Lymphadenopathy:     Cervical: No cervical adenopathy.  Skin:    General: Skin is warm and dry.  Neurological:      General: No focal deficit present.     Mental Status: She is alert and oriented to person, place, and time.     Cranial Nerves: No cranial nerve deficit.     Coordination: Coordination normal.     Gait: Gait normal.  Psychiatric:        Mood and Affect: Mood normal.        Behavior: Behavior normal.        Thought Content: Thought content normal.        Judgment: Judgment normal.     Results for orders placed or performed in visit on 11/27/22  Comprehensive metabolic panel   Collection Time: 11/27/22 11:22 AM  Result Value Ref Range   Sodium 138 135 - 145 mEq/L   Potassium 3.4 (L) 3.5 - 5.1 mEq/L   Chloride 104 96 - 112 mEq/L   CO2 27 19 - 32 mEq/L   Glucose, Bld 87 70 - 99 mg/dL   BUN 7 6 - 23 mg/dL   Creatinine, Ser 9.23 0.40 - 1.20 mg/dL   Total Bilirubin 0.9 0.2 - 1.2 mg/dL   Alkaline Phosphatase 44 39 - 117 U/L   AST 21 0 - 37 U/L   ALT 26 0 - 35 U/L   Total Protein 7.5 6.0 - 8.3 g/dL   Albumin 4.5 3.5 - 5.2 g/dL   GFR 898.10 >39.99 mL/min   Calcium 9.3 8.4 - 10.5 mg/dL  Lipid panel   Collection Time: 11/27/22 11:22 AM  Result Value Ref Range   Cholesterol 149 0 - 200 mg/dL   Triglycerides 46.9 0.0 - 149.0 mg/dL   HDL 58.29 >60.99 mg/dL   VLDL 89.3 0.0 - 59.9 mg/dL   LDL Cholesterol 97 0 - 99 mg/dL   Total CHOL/HDL Ratio 4    NonHDL 107.24   TSH   Collection Time: 11/27/22 11:22 AM  Result Value Ref Range   TSH 0.58 0.35 - 5.50 uIU/mL      Assessment & Plan:   Problem List Items Addressed This Visit       Cardiovascular and Mediastinum   Primary hypertension   Chronic hypertension with recent non-adherence has resulted in a current BP of 148/100 mmHg. Previously controlled on medication. Refill nifedipine  60 mg daily and continue home blood pressure monitoring. Follow-up in 4-6 weeks. Check CMP, CBC, lipid panel today.       Relevant Medications   NIFEdipine  (PROCARDIA  XL/NIFEDICAL XL) 60 MG 24 hr tablet   Other Relevant Orders   CBC with  Differential/Platelet   Comprehensive metabolic panel with GFR   Lipid panel     Other   Major depression, recurrent (HCC)   Chronic depression and anxiety are exacerbated by life stressors. Current Zoloft   treatment is insufficient, with no suicidal ideation but feelings of being overwhelmed. She is interested in increasing Zoloft  and pursuing therapy. Increase Zoloft  to 100mg  daily and refer to therapy or e-counseling. Follow-up in 4-6 weeks.       Relevant Medications   sertraline  (ZOLOFT ) 100 MG tablet   Anxiety   Chronic depression and anxiety are exacerbated by life stressors. Current Zoloft  treatment is insufficient, with no suicidal ideation but feelings of being overwhelmed. She is interested in increasing Zoloft  and pursuing therapy. Increase Zoloft  to 100mg  daily and refer to therapy or e-counseling. Follow-up in 4-6 weeks.       Relevant Medications   sertraline  (ZOLOFT ) 100 MG tablet   Chronic pain of left knee   She had a fall and tore her left ACL. She is following with ortho who recommended surgery. She is waiting for the best time to schedule this. Continue wearing the knee brace and take tylenol  and ibuprofen  as needed.       Relevant Medications   sertraline  (ZOLOFT ) 100 MG tablet   Routine general medical examination at a health care facility - Primary   Health maintenance reviewed and updated. Discussed nutrition, exercise. Follow-up 1 year.        Other Visit Diagnoses       Screening for cervical cancer       Pap with HPV done today   Relevant Orders   Cytology - PAP     Chronic fatigue       Check TSH, vitamin D , vitamin B12. Encourage regular sleep and exericse. Increase zoloft  as noted above. Treat based on results.   Relevant Orders   TSH   VITAMIN D  25 Hydroxy (Vit-D Deficiency, Fractures)   Vitamin B12      Follow up plan: Return in about 4 weeks (around 12/17/2023) for 4-6 weeks , Anxiety, Depression.   LABORATORY TESTING:  - Pap smear: pap  done  IMMUNIZATIONS:   - Tdap: Tetanus vaccination status reviewed: last tetanus booster within 10 years. - Influenza: Postponed to flu season - Pneumovax: Not applicable - Prevnar: Not applicable - HPV: Declined - Shingrix vaccine: Not applicable  SCREENING: -Mammogram: Not applicable  - Colonoscopy: Not applicable  - Bone Density: Not applicable   PATIENT COUNSELING:   Advised to take 1 mg of folate supplement per day if capable of pregnancy.   Sexuality: Discussed sexually transmitted diseases, partner selection, use of condoms, avoidance of unintended pregnancy  and contraceptive alternatives.   Advised to avoid cigarette smoking.  I discussed with the patient that most people either abstain from alcohol or drink within safe limits (<=14/week and <=4 drinks/occasion for males, <=7/weeks and <= 3 drinks/occasion for females) and that the risk for alcohol disorders and other health effects rises proportionally with the number of drinks per week and how often a drinker exceeds daily limits.  Discussed cessation/primary prevention of drug use and availability of treatment for abuse.   Diet: Encouraged to adjust caloric intake to maintain  or achieve ideal body weight, to reduce intake of dietary saturated fat and total fat, to limit sodium intake by avoiding high sodium foods and not adding table salt, and to maintain adequate dietary potassium and calcium preferably from fresh fruits, vegetables, and low-fat dairy products.    stressed the importance of regular exercise  Injury prevention: Discussed safety belts, safety helmets, smoke detector, smoking near bedding or upholstery.   Dental health: Discussed importance of regular tooth brushing, flossing, and dental visits.  NEXT PREVENTATIVE PHYSICAL DUE IN 1 YEAR. Return in about 4 weeks (around 12/17/2023) for 4-6 weeks , Anxiety, Depression.  Sandy Townsend A Ramey [place]

## 2023-11-19 NOTE — Assessment & Plan Note (Signed)
 Health maintenance reviewed and updated. Discussed nutrition, exercise. Follow-up 1 year.

## 2023-11-19 NOTE — Assessment & Plan Note (Signed)
 Chronic depression and anxiety are exacerbated by life stressors. Current Zoloft  treatment is insufficient, with no suicidal ideation but feelings of being overwhelmed. She is interested in increasing Zoloft  and pursuing therapy. Increase Zoloft  to 100mg  daily and refer to therapy or e-counseling. Follow-up in 4-6 weeks.

## 2023-11-19 NOTE — Assessment & Plan Note (Signed)
 She had a fall and tore her left ACL. She is following with ortho who recommended surgery. She is waiting for the best time to schedule this. Continue wearing the knee brace and take tylenol  and ibuprofen  as needed.

## 2023-11-19 NOTE — Patient Instructions (Addendum)
 It was great to see you!  We are checking your labs today and will let you know the results via mychart/phone.   Let's increase your zoloft  to 100 mg daily  I have refilled your blood pressure medication   Reach out and schedule an appointment with a therapist   Let's follow-up in 4-6 weeks, sooner if you have concerns.  If a referral was placed today, you will be contacted for an appointment. Please note that routine referrals can sometimes take up to 3-4 weeks to process. Please call our office if you haven't heard anything after this time frame.  Take care,  Tinnie Harada, NP

## 2023-11-19 NOTE — Assessment & Plan Note (Signed)
 Chronic hypertension with recent non-adherence has resulted in a current BP of 148/100 mmHg. Previously controlled on medication. Refill nifedipine  60 mg daily and continue home blood pressure monitoring. Follow-up in 4-6 weeks. Check CMP, CBC, lipid panel today.

## 2023-11-20 LAB — CYTOLOGY - PAP
Adequacy: ABSENT
Chlamydia: NEGATIVE
Comment: NEGATIVE
Comment: NEGATIVE
Comment: NORMAL
Diagnosis: NEGATIVE
High risk HPV: NEGATIVE
Neisseria Gonorrhea: NEGATIVE

## 2023-12-23 ENCOUNTER — Ambulatory Visit: Admitting: Nurse Practitioner

## 2023-12-24 ENCOUNTER — Ambulatory Visit (INDEPENDENT_AMBULATORY_CARE_PROVIDER_SITE_OTHER): Admitting: Nurse Practitioner

## 2023-12-24 ENCOUNTER — Encounter: Payer: Self-pay | Admitting: Nurse Practitioner

## 2023-12-24 VITALS — BP 150/100 | HR 82 | Temp 97.1°F | Ht 61.0 in | Wt 180.2 lb

## 2023-12-24 DIAGNOSIS — F331 Major depressive disorder, recurrent, moderate: Secondary | ICD-10-CM

## 2023-12-24 DIAGNOSIS — I10 Essential (primary) hypertension: Secondary | ICD-10-CM | POA: Diagnosis not present

## 2023-12-24 DIAGNOSIS — F419 Anxiety disorder, unspecified: Secondary | ICD-10-CM

## 2023-12-24 DIAGNOSIS — E538 Deficiency of other specified B group vitamins: Secondary | ICD-10-CM | POA: Diagnosis not present

## 2023-12-24 DIAGNOSIS — E559 Vitamin D deficiency, unspecified: Secondary | ICD-10-CM | POA: Insufficient documentation

## 2023-12-24 MED ORDER — VITAMIN D (ERGOCALCIFEROL) 1.25 MG (50000 UNIT) PO CAPS: 50000.0000 [IU] | ORAL_CAPSULE | ORAL | 0 refills | Status: AC

## 2023-12-24 MED ORDER — SERTRALINE HCL 100 MG PO TABS: 100.0000 mg | ORAL_TABLET | Freq: Every day | ORAL | 1 refills | Status: DC

## 2023-12-24 MED ORDER — NIFEDIPINE ER OSMOTIC RELEASE 60 MG PO TB24: 60.0000 mg | ORAL_TABLET | Freq: Every day | ORAL | 1 refills | Status: AC

## 2023-12-24 NOTE — Patient Instructions (Signed)
 It was great to see you!  I have refilled your medications  Start vitamin B12 500-1,000 mcg daily   Let's follow-up in 3 months, sooner if you have concerns.  If a referral was placed today, you will be contacted for an appointment. Please note that routine referrals can sometimes take up to 3-4 weeks to process. Please call our office if you haven't heard anything after this time frame.  Take care,  Tinnie Harada, NP

## 2023-12-24 NOTE — Progress Notes (Signed)
 Established Patient Office Visit  Subjective   Patient ID: Treniya Lobb, female    DOB: Aug 12, 1985  Age: 38 y.o. MRN: [id number]  Chief Complaint  Patient presents with   Anxiety and depression    Follow up, has not taken medication with issues at the pharmacy    HPI Discussed the use of AI scribe software for clinical note transcription with the patient, who gave verbal consent to proceed.  History of Present Illness   Azya Barbero is a 38 year old female who presents for a follow-up regarding medication management and pharmacy issues.  She has faced challenges in obtaining her prescribed medications due to pharmacy issues and transportation problems over the summer. As a result, she has not started her new blood pressure medication, increased dose of zoloft , or vitamin D  supplement. She has not started the vitamin B12 supplement due to confusion over the available types. She states that she is doing slightly better than she was last time. She denies SI/HI.       ROS See pertinent positives and negatives per HPI.    Objective:     BP (!) 150/100 (BP Location: Left Arm, Cuff Size: Large)   Pulse 82   Temp (!) 97.1 F (36.2 C)   Ht 5' 1 (1.549 m)   Wt 180 lb 3.2 oz (81.7 kg)   SpO2 98%   BMI 34.05 kg/m  BP Readings from Last 3 Encounters:  12/24/23 (!) 150/100  11/19/23 (!) 148/100  01/31/23 128/76   Wt Readings from Last 3 Encounters:  12/24/23 180 lb 3.2 oz (81.7 kg)  11/19/23 179 lb 3.2 oz (81.3 kg)  11/27/22 175 lb (79.4 kg)      Physical Exam Vitals and nursing note reviewed.  Constitutional:      General: She is not in acute distress.    Appearance: Normal appearance.  HENT:     Head: Normocephalic.  Eyes:     Conjunctiva/sclera: Conjunctivae normal.  Cardiovascular:     Rate and Rhythm: Normal rate and regular rhythm.     Pulses: Normal pulses.     Heart sounds: Normal heart sounds.  Pulmonary:     Effort: Pulmonary effort is normal.      Breath sounds: Normal breath sounds.  Musculoskeletal:     Cervical back: Normal range of motion.  Skin:    General: Skin is warm.  Neurological:     General: No focal deficit present.     Mental Status: She is alert and oriented to person, place, and time.  Psychiatric:        Mood and Affect: Mood normal.        Behavior: Behavior normal.        Thought Content: Thought content normal.        Judgment: Judgment normal.      Assessment & Plan:   Problem List Items Addressed This Visit       Cardiovascular and Mediastinum   Primary hypertension   Chronic, not controlled. Blood pressure is elevated at 150/100 mmHg due to issues with the pharmacy. Called the pharmacy who states they have received the prescription and will fill it today.       Relevant Medications   NIFEdipine  (PROCARDIA  XL/NIFEDICAL XL) 60 MG 24 hr tablet     Other   Major depression, recurrent - Primary   Chronic, ongoing. Mood has improved but sertraline  not started due to pharmacy issues. Resent prescription for sertraline  100 mg to pharmacy. Called  the pharmacy who states they have received the prescription and will fill it today.       Relevant Medications   sertraline  (ZOLOFT ) 100 MG tablet   Anxiety   Chronic, ongoing. Mood has improved but sertraline  not started due to pharmacy issues. Resent prescription for sertraline  100 mg to pharmacy. Called the pharmacy who states they have received the prescription and will fill it today.       Relevant Medications   sertraline  (ZOLOFT ) 100 MG tablet   Vitamin B 12 deficiency   Supplementation not started due to confusion over options. Advised starting vitamin B12 500 to 1000 mcg daily, over-the-counter. Recommended any brand, including store brands.       Vitamin D  deficiency   Re-sent vitamin D  50,000 units to take weekly for 12 weeks to the pharmacy, then restart vitamin D  2,000 units daily.        Return in about 3 months (around 03/24/2024) for  Anxiety, Depression, vit d, vit b12.    Tinnie DELENA Harada, NP

## 2023-12-24 NOTE — Assessment & Plan Note (Signed)
 Chronic, ongoing. Mood has improved but sertraline  not started due to pharmacy issues. Resent prescription for sertraline  100 mg to pharmacy. Called the pharmacy who states they have received the prescription and will fill it today.

## 2023-12-24 NOTE — Assessment & Plan Note (Signed)
 Chronic, not controlled. Blood pressure is elevated at 150/100 mmHg due to issues with the pharmacy. Called the pharmacy who states they have received the prescription and will fill it today.

## 2023-12-24 NOTE — Assessment & Plan Note (Signed)
 Supplementation not started due to confusion over options. Advised starting vitamin B12 500 to 1000 mcg daily, over-the-counter. Recommended any brand, including store brands.

## 2023-12-24 NOTE — Assessment & Plan Note (Signed)
 Re-sent vitamin D  50,000 units to take weekly for 12 weeks to the pharmacy, then restart vitamin D  2,000 units daily.

## 2024-01-25 ENCOUNTER — Other Ambulatory Visit: Payer: Self-pay | Admitting: Nurse Practitioner

## 2024-01-25 NOTE — Telephone Encounter (Signed)
 Requesting: SERTRALINE  100MG  TABLETS  Last Visit: 12/24/2023 Next Visit: Visit date not found Last Refill: 12/24/2023  Please Advise    **Patient requests 90 days supply**

## 2024-03-21 ENCOUNTER — Other Ambulatory Visit: Payer: Self-pay | Admitting: Internal Medicine

## 2024-03-21 DIAGNOSIS — F331 Major depressive disorder, recurrent, moderate: Secondary | ICD-10-CM

## 2024-03-21 DIAGNOSIS — F411 Generalized anxiety disorder: Secondary | ICD-10-CM

## 2024-03-21 NOTE — Telephone Encounter (Signed)
 Pt is requesting refill for sertraline  (ZOLOFT ) 100 MG tablet   LOV 12/24/23 FOV not scheduled LRF 01/25/24

## 2024-03-22 ENCOUNTER — Other Ambulatory Visit: Payer: Self-pay | Admitting: Nurse Practitioner

## 2024-03-22 MED ORDER — SERTRALINE HCL 100 MG PO TABS: 100.0000 mg | ORAL_TABLET | Freq: Every day | ORAL | 0 refills | Status: AC
